# Patient Record
Sex: Female | Born: 1955 | Race: White | Hispanic: No | Marital: Married | Smoking: Never smoker
Health system: Southern US, Community
[De-identification: ages and names within clinical notes are randomized; demographics above are authoritative.]

## PROBLEM LIST (undated history)

## (undated) DIAGNOSIS — Z9889 Other specified postprocedural states: Secondary | ICD-10-CM

## (undated) DIAGNOSIS — M1990 Unspecified osteoarthritis, unspecified site: Secondary | ICD-10-CM

## (undated) DIAGNOSIS — R112 Nausea with vomiting, unspecified: Secondary | ICD-10-CM

## (undated) DIAGNOSIS — N189 Chronic kidney disease, unspecified: Secondary | ICD-10-CM

## (undated) DIAGNOSIS — G4730 Sleep apnea, unspecified: Secondary | ICD-10-CM

## (undated) DIAGNOSIS — M797 Fibromyalgia: Secondary | ICD-10-CM

## (undated) DIAGNOSIS — C801 Malignant (primary) neoplasm, unspecified: Secondary | ICD-10-CM

## (undated) HISTORY — PX: OTHER SURGICAL HISTORY: SHX169

## (undated) HISTORY — PX: BREAST SURGERY: SHX581

## (undated) HISTORY — PX: ABDOMINAL HYSTERECTOMY: SHX81

---

## 1998-11-10 ENCOUNTER — Other Ambulatory Visit: Admission: RE | Admit: 1998-11-10 | Discharge: 1998-11-10 | Payer: Self-pay | Admitting: Plastic Surgery

## 1998-11-10 ENCOUNTER — Encounter (INDEPENDENT_AMBULATORY_CARE_PROVIDER_SITE_OTHER): Payer: Self-pay

## 1999-06-03 ENCOUNTER — Encounter (INDEPENDENT_AMBULATORY_CARE_PROVIDER_SITE_OTHER): Payer: Self-pay | Admitting: Specialist

## 1999-06-03 ENCOUNTER — Inpatient Hospital Stay (HOSPITAL_COMMUNITY): Admission: RE | Admit: 1999-06-03 | Discharge: 1999-06-06 | Payer: Self-pay | Admitting: Urology

## 1999-07-30 ENCOUNTER — Other Ambulatory Visit: Admission: RE | Admit: 1999-07-30 | Discharge: 1999-07-30 | Payer: Self-pay | Admitting: Gynecology

## 1999-12-06 ENCOUNTER — Encounter: Payer: Self-pay | Admitting: Urology

## 1999-12-06 ENCOUNTER — Encounter: Admission: RE | Admit: 1999-12-06 | Discharge: 1999-12-06 | Payer: Self-pay | Admitting: Urology

## 2000-06-20 ENCOUNTER — Encounter: Admission: RE | Admit: 2000-06-20 | Discharge: 2000-06-20 | Payer: Self-pay | Admitting: Urology

## 2000-06-20 ENCOUNTER — Encounter: Payer: Self-pay | Admitting: Urology

## 2000-08-02 ENCOUNTER — Other Ambulatory Visit: Admission: RE | Admit: 2000-08-02 | Discharge: 2000-08-02 | Payer: Self-pay | Admitting: Gynecology

## 2000-09-24 ENCOUNTER — Ambulatory Visit (HOSPITAL_BASED_OUTPATIENT_CLINIC_OR_DEPARTMENT_OTHER): Admission: RE | Admit: 2000-09-24 | Discharge: 2000-09-24 | Payer: Self-pay | Admitting: Family Medicine

## 2000-12-20 ENCOUNTER — Encounter: Payer: Self-pay | Admitting: Urology

## 2000-12-20 ENCOUNTER — Encounter: Admission: RE | Admit: 2000-12-20 | Discharge: 2000-12-20 | Payer: Self-pay | Admitting: Urology

## 2001-06-08 ENCOUNTER — Encounter: Payer: Self-pay | Admitting: Urology

## 2001-06-08 ENCOUNTER — Encounter: Admission: RE | Admit: 2001-06-08 | Discharge: 2001-06-08 | Payer: Self-pay | Admitting: Urology

## 2001-08-14 ENCOUNTER — Other Ambulatory Visit: Admission: RE | Admit: 2001-08-14 | Discharge: 2001-08-14 | Payer: Self-pay | Admitting: Gynecology

## 2001-12-14 ENCOUNTER — Encounter: Payer: Self-pay | Admitting: Urology

## 2001-12-14 ENCOUNTER — Encounter: Admission: RE | Admit: 2001-12-14 | Discharge: 2001-12-14 | Payer: Self-pay | Admitting: Urology

## 2002-06-21 ENCOUNTER — Encounter: Admission: RE | Admit: 2002-06-21 | Discharge: 2002-06-21 | Payer: Self-pay | Admitting: Urology

## 2002-06-21 ENCOUNTER — Encounter: Payer: Self-pay | Admitting: Urology

## 2002-12-04 ENCOUNTER — Encounter: Admission: RE | Admit: 2002-12-04 | Discharge: 2002-12-04 | Payer: Self-pay | Admitting: Urology

## 2002-12-06 ENCOUNTER — Other Ambulatory Visit: Admission: RE | Admit: 2002-12-06 | Discharge: 2002-12-06 | Payer: Self-pay | Admitting: Gynecology

## 2003-06-09 ENCOUNTER — Ambulatory Visit (HOSPITAL_COMMUNITY): Admission: RE | Admit: 2003-06-09 | Discharge: 2003-06-09 | Payer: Self-pay | Admitting: Urology

## 2003-10-22 ENCOUNTER — Ambulatory Visit (HOSPITAL_COMMUNITY): Admission: RE | Admit: 2003-10-22 | Discharge: 2003-10-22 | Payer: Self-pay | Admitting: Family Medicine

## 2003-11-12 ENCOUNTER — Ambulatory Visit (HOSPITAL_COMMUNITY): Admission: RE | Admit: 2003-11-12 | Discharge: 2003-11-12 | Payer: Self-pay | Admitting: Unknown Physician Specialty

## 2003-11-19 ENCOUNTER — Encounter: Admission: RE | Admit: 2003-11-19 | Discharge: 2003-12-10 | Payer: Self-pay | Admitting: Optometry

## 2003-11-25 ENCOUNTER — Ambulatory Visit (HOSPITAL_COMMUNITY): Admission: RE | Admit: 2003-11-25 | Discharge: 2003-11-25 | Payer: Self-pay | Admitting: Unknown Physician Specialty

## 2004-01-12 ENCOUNTER — Ambulatory Visit (HOSPITAL_COMMUNITY): Admission: RE | Admit: 2004-01-12 | Discharge: 2004-01-12 | Payer: Self-pay | Admitting: Unknown Physician Specialty

## 2004-01-26 ENCOUNTER — Ambulatory Visit (HOSPITAL_COMMUNITY): Admission: RE | Admit: 2004-01-26 | Discharge: 2004-01-26 | Payer: Self-pay | Admitting: Urology

## 2004-03-02 ENCOUNTER — Other Ambulatory Visit: Admission: RE | Admit: 2004-03-02 | Discharge: 2004-03-02 | Payer: Self-pay | Admitting: Gynecology

## 2004-05-11 ENCOUNTER — Ambulatory Visit (HOSPITAL_COMMUNITY): Admission: RE | Admit: 2004-05-11 | Discharge: 2004-05-11 | Payer: Self-pay | Admitting: Unknown Physician Specialty

## 2004-07-28 ENCOUNTER — Ambulatory Visit (HOSPITAL_COMMUNITY): Admission: RE | Admit: 2004-07-28 | Discharge: 2004-07-28 | Payer: Self-pay | Admitting: Urology

## 2005-03-30 ENCOUNTER — Other Ambulatory Visit: Admission: RE | Admit: 2005-03-30 | Discharge: 2005-03-30 | Payer: Self-pay | Admitting: Gynecology

## 2005-10-12 ENCOUNTER — Encounter: Admission: RE | Admit: 2005-10-12 | Discharge: 2006-01-10 | Payer: Self-pay | Admitting: Family Medicine

## 2005-10-28 ENCOUNTER — Ambulatory Visit: Payer: Self-pay | Admitting: Internal Medicine

## 2005-11-11 ENCOUNTER — Ambulatory Visit: Payer: Self-pay | Admitting: Internal Medicine

## 2006-01-20 IMAGING — CR DG CHEST 2V
2 series · 2 of 2 positions shown · non-contrast
Comparison: none

CLINICAL DATA: Renal cell carcinoma, question metastatic disease.
 TWO VIEW CHEST
 The heart size and mediastinal contours are unremarkable.  The lungs are clear.  The visualized skeleton is unremarkable.  
 IMPRESSION
 No active disease.

[view not recorded (1 of 2)]
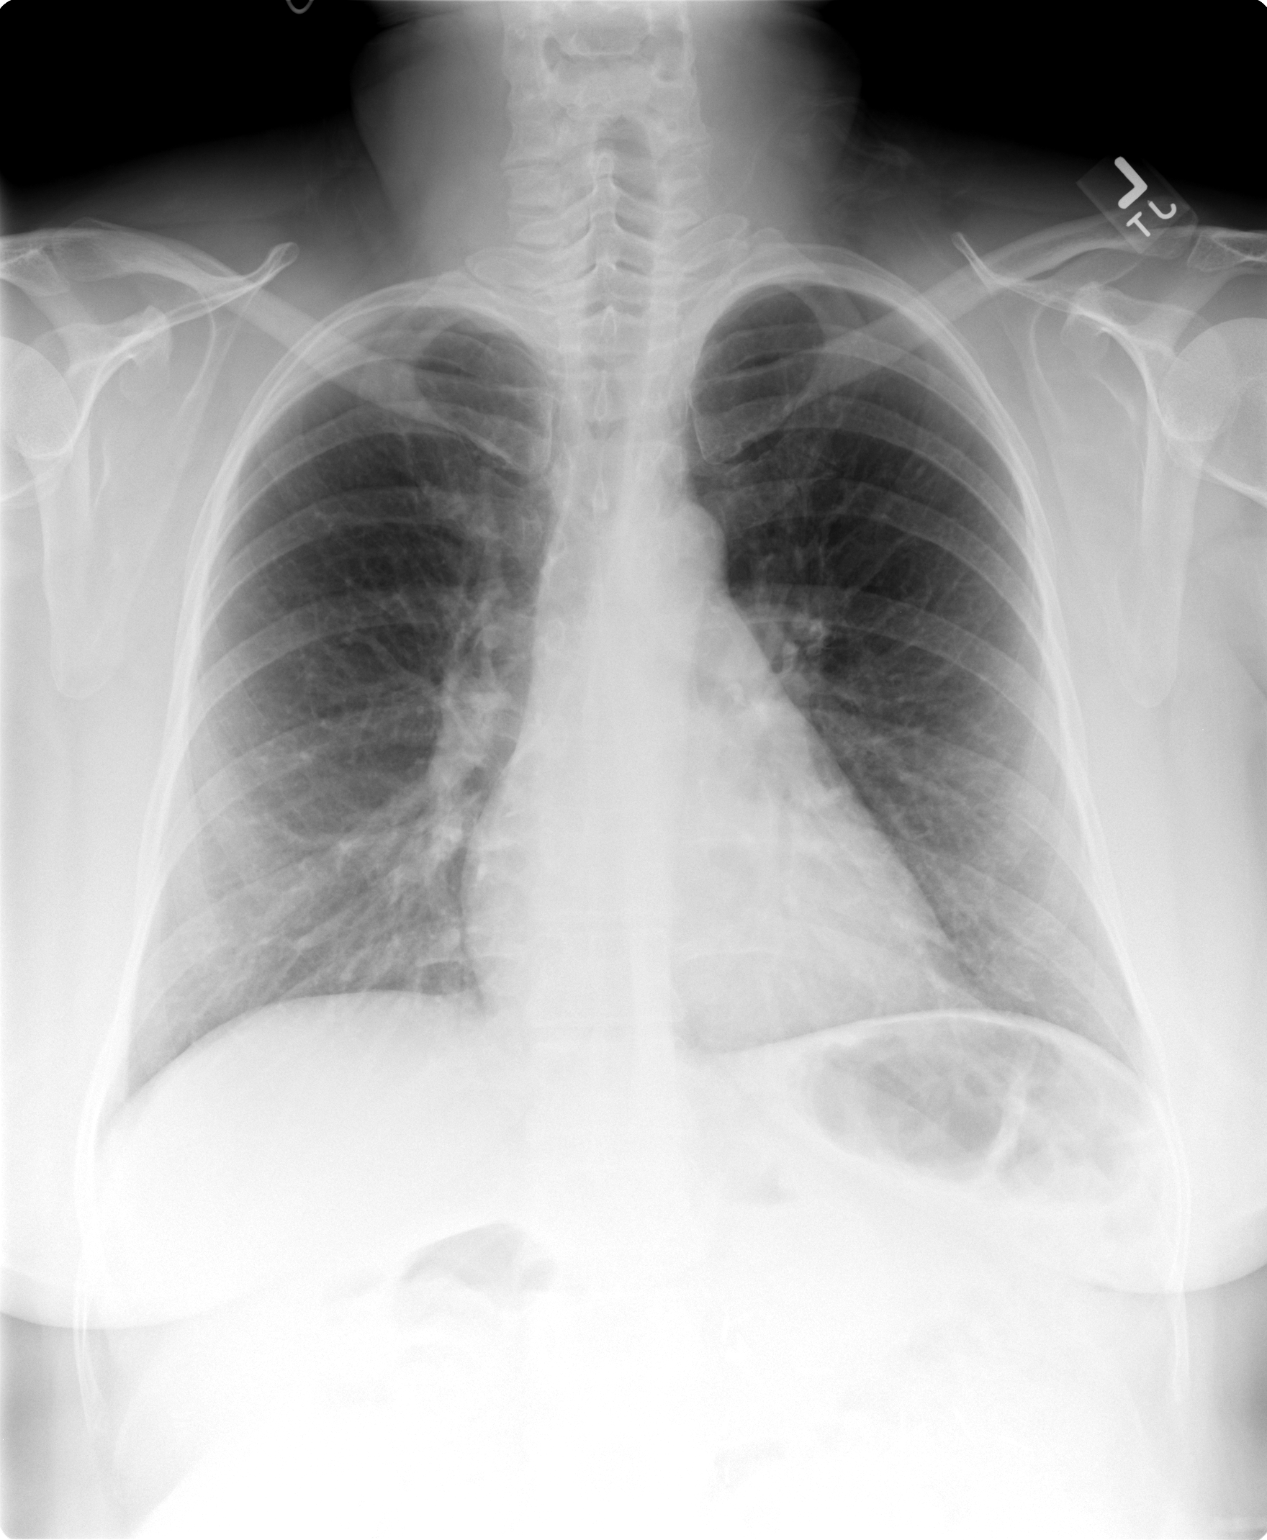

[view not recorded (2 of 2)]
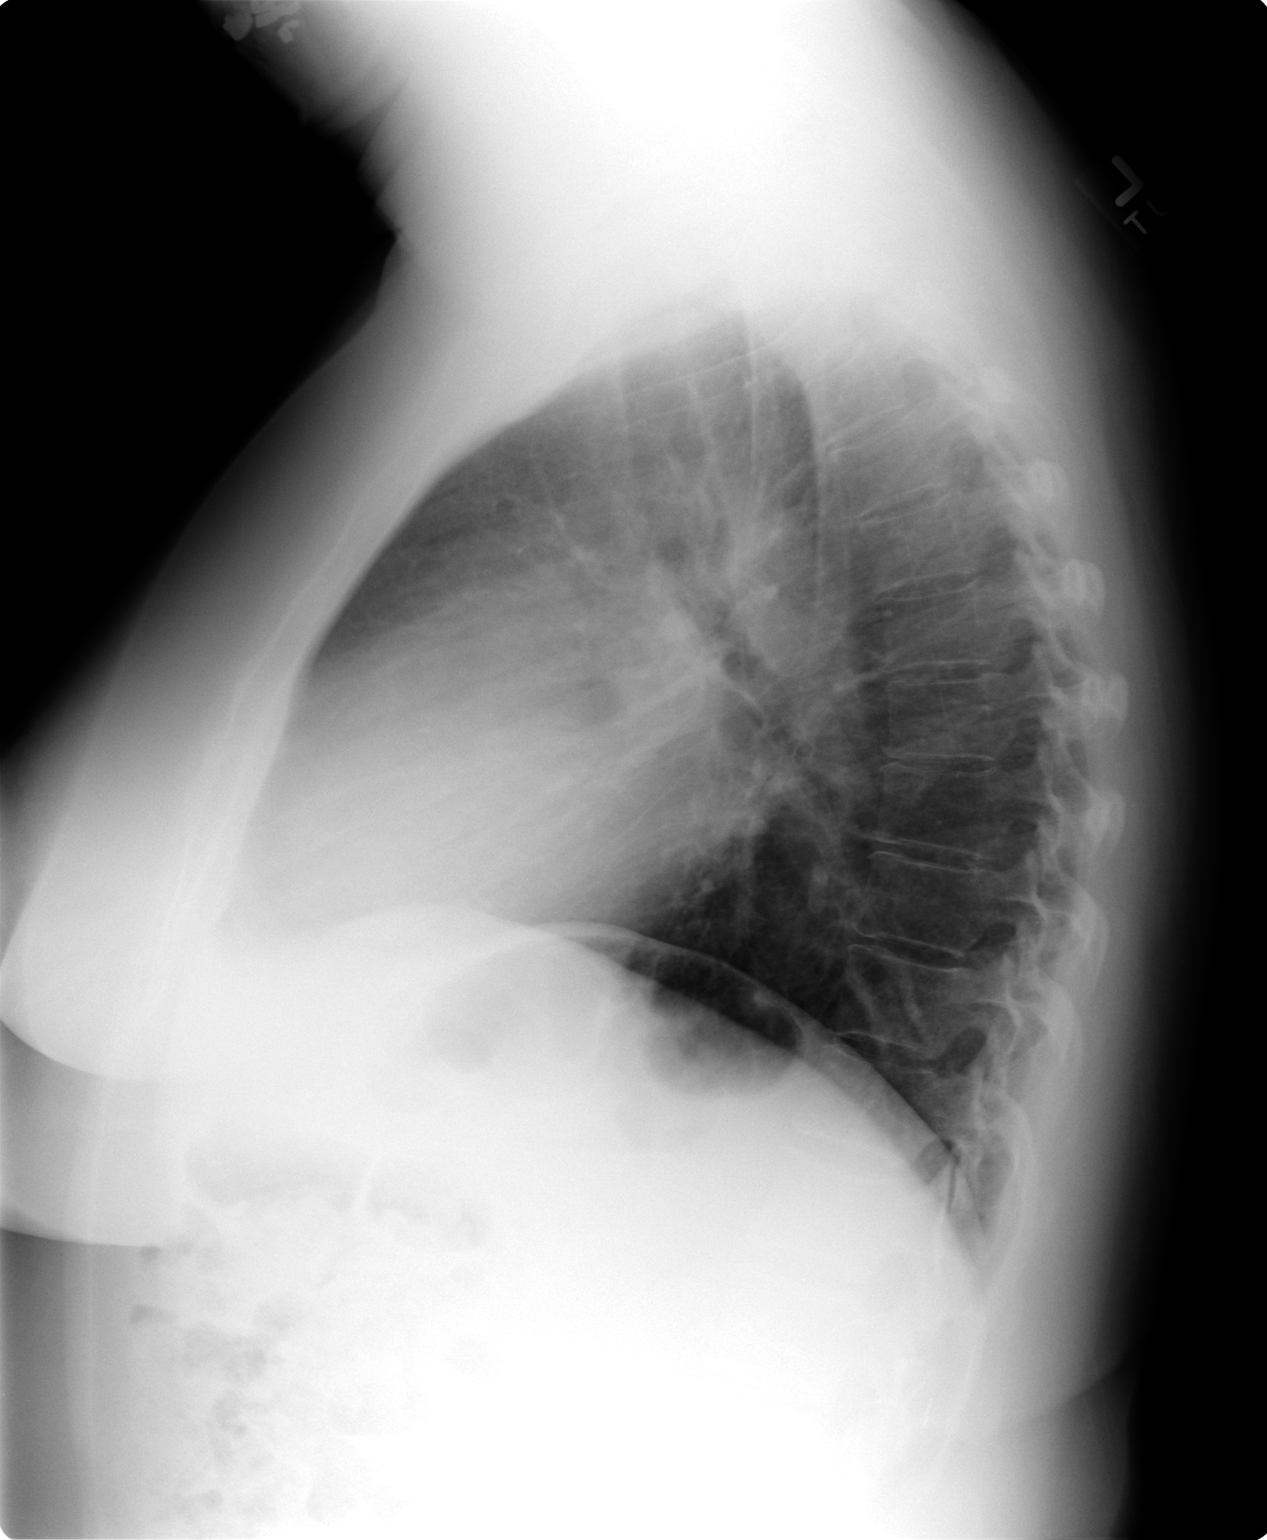

[2 of 2 positions shown; findings below may reference images not displayed]

## 2006-03-02 ENCOUNTER — Ambulatory Visit (HOSPITAL_COMMUNITY): Admission: RE | Admit: 2006-03-02 | Discharge: 2006-03-02 | Payer: Self-pay | Admitting: Unknown Physician Specialty

## 2006-07-20 ENCOUNTER — Encounter: Admission: RE | Admit: 2006-07-20 | Discharge: 2006-07-20 | Payer: Self-pay | Admitting: Orthopedic Surgery

## 2010-01-23 ENCOUNTER — Encounter: Payer: Self-pay | Admitting: Unknown Physician Specialty

## 2010-05-21 NOTE — Op Note (Signed)
[facility]  Patient:    Karla Bishop, Karla Bishop                          MRN: [id number] Proc. Date: 06/03/99 Adm. Date:  [id number] Disc. Date: [id number] Attending:  Evlyn Clines CC:         Tammy R. Collins Scotland, M.D.             Excell Seltzer. Annabell Howells, M.D.                           Operative Report  PROCEDURE:  Left radial nephrectomy.  PREOPERATIVE DIAGNOSIS:  Left renal mass.  POSTOPERATIVE DIAGNOSIS:  Left renal mass.  SURGEON:  Excell Seltzer. Annabell Howells, M.D.  ASSISTANT:  Veverly Fells. Vernie Ammons, M.D.  ANESTHESIA:  General.  DRAINS:  Foley.  SPECIMEN:  Left kidney.  COMPLICATIONS:  None.  INDICATION:  Karla Bishop is a 55 year old white female originally sent in consultation by Dr. Bayard Beaver. Spear for a left renal mass that was discovered incidentally on CT scan during evaluation for IBS.  Karla Bishop was found to have an enhancing left lower pole mass measuring 2 x 2 x 3 cm with irregular margins. After reviewing the films, it was felt that radical nephrectomy was indicated.  FINDINGS AND PROCEDURE:  The patient was taken to the operating room. Karla Bishop received Ancef 1 gm and was fitted with ______ and PAS hose.  Karla Bishop was placed in a supine position and general anesthetic was induced.  A Foley catheter was inserted using sterile technique.  A bump was placed under her left chest, her abdomen was prepped with Betadine solution and Karla Bishop was draped in the usual sterile fashion.  A left subcostal incision was made with a knife.  The subcutaneous tissues were opened with the Bovie.  The musculofascial layers were incised with the Bovie.  The peritoneal cavity was entered.  A self-retaining retractor was placed.  The white line of Toldt was taken down on the left, allowing medial reflection of the colon.  Posterior dissection of the kidney within the Gerotas fascia was performed.  We then dissected the colonic mesentery off of Gerotas fascia anteriorly.  The lower pole of the kidney was  dissected out and the ureter was divided between clips; a small vessel in this area was also divided.  We then dissected into the hilum, where the patient was noted to have two renal veins.  The lower pole renal vein was dissected out and then doubly ligated with 0 silk ties and a clip was placed across the base of this to secure hemostasis; this allowed easier exposure of the artery which was dissected out, doubly ligated with 0 silk ties and divided.   Once the artery was divided, the remaining main renal vein was dissected out and doubly ligated with 0 silk ties and divided.  Some remaining attachments to the pedicle were taken down between clips.  The upper pole was dissected out, leaving the adrenal gland in situ.  The specimen was then removed.  Inspection revealed some bleeding from the adrenal gland, which was controlled with clips and a 3-0 chromic suture.  Once all bleeding had been controlled in the fossa, the wound was irrigated.  The bowel was inspected; there was no evidence of injury or ischemia.  The spleen was apparent through the peritoneum and the upper portion of the renal fossa and it was without injury.  At this point, the bowel was returned to its appropriate position, the omentum was draped over the bowel, the abdominal wound was closed in two layers using running #1 PDS, the subcutaneous tissue was irrigated and the skin was closed with clips.  The patients anesthetic was reversed.  Karla Bishop was moved to the recovery room in stable condition.  There were no complications. DD:  06/03/99 TD:  06/07/99 Job: [id number]

## 2010-05-21 NOTE — Discharge Summary (Signed)
[facility]  Patient:    Karla Bishop, Karla Bishop                          MRN: [id number] Adm. Date:  [id number] Disc. Date: [id number] Attending:  Evlyn Clines CC:         Tammy R. Collins Scotland, M.D.             Excell Seltzer. Annabell Howells, M.D.                           Discharge Summary  HISTORY OF PRESENT ILLNESS:  Briefly, Ms. Pakula is a 55 year old white female who is being evaluated for an irritable bowel syndrome, ended up with a CT scan which revealed and incidental 2 x 2 x 3 cm left renal mass suspicious for renal cell carcinoma.  If was felt that nephrectomy was indicated.  ALLERGIES:  Her past history is pertinent for no allergies.  CURRENT MEDICATIONS: 1. Wellbutrin 150 mg daily. 2. Allegra one daily. 3. Premarin 0.625 mg daily. 4. Etodolac 400 mg daily as needed. ______ for arthritis, anxiety and    irritable bowel syndrome.  PAST SURGICAL HISTORY:  For a hysterectomy and anterior repair in 1884.  Right ovarian removal in 1987, left ovarian removal in 1991.  Breast reduction in 2000.  For additional details of the history and physical, please see the office H&P.  ACCESSORY CLINICAL INFORMATION:  Admission labs, CBC within normal limits. Chemistries within normal limits.  Blood type 0 positive.  Antibody negative.  HOSPITAL COURSE:  On the day of admission, the patient was taken to the operating room where she underwent a left radical nephrectomy through a left subcostal incision.  She tolerated the procedure well and was left with a Foley catheter.  Postoperatively, she did well.  Her Foley catheter was removed on the first postoperative day.  She was on a clear liquid diet the first postoperative day.  Hemoglobin was 11.6.  BMP was unremarkable. IV fluid were decreased.  Her activity was increased.  On second postoperative day she was doing well, incision was intact.  Her IV was discontinued and diet was advanced.  Her ambulation was increased.  On the third  postoperative day she was felt to be ready for discharge home.  Her final pathology revealed a renal cell carcinoma, clear cell type, 2.4 cm in greatest dimension confined with the kidney, first stage PT-1, N0, M0.  There were no complications during her admission.  At discharge medications include Vicodin one or two p.o. q.4-6h. p.r.n. pain. She was instructed to follow up with me in one week for staple removal.  DISPOSITION:  To home.  CONDITION:  Improved. DD:  06/22/99 TD:  06/23/99 Job: [id number]

## 2012-04-05 ENCOUNTER — Other Ambulatory Visit: Payer: Self-pay | Admitting: Orthopedic Surgery

## 2012-05-14 ENCOUNTER — Ambulatory Visit (HOSPITAL_COMMUNITY)
Admission: RE | Admit: 2012-05-14 | Discharge: 2012-05-14 | Disposition: A | Discharge status: Home / Self Care | Payer: 59 | Source: Ambulatory Visit | Attending: Orthopedic Surgery | Admitting: Orthopedic Surgery

## 2012-05-14 ENCOUNTER — Encounter (HOSPITAL_COMMUNITY)
Admission: RE | Admit: 2012-05-14 | Discharge: 2012-05-14 | Disposition: A | Discharge status: Home / Self Care | Payer: 59 | Source: Ambulatory Visit | Attending: Orthopedic Surgery | Admitting: Orthopedic Surgery

## 2012-05-14 ENCOUNTER — Encounter (HOSPITAL_COMMUNITY): Payer: Self-pay

## 2012-05-14 DIAGNOSIS — G4730 Sleep apnea, unspecified: Secondary | ICD-10-CM | POA: Insufficient documentation

## 2012-05-14 DIAGNOSIS — Z01818 Encounter for other preprocedural examination: Secondary | ICD-10-CM | POA: Insufficient documentation

## 2012-05-14 DIAGNOSIS — M47814 Spondylosis without myelopathy or radiculopathy, thoracic region: Secondary | ICD-10-CM | POA: Insufficient documentation

## 2012-05-14 DIAGNOSIS — Z01812 Encounter for preprocedural laboratory examination: Secondary | ICD-10-CM | POA: Insufficient documentation

## 2012-05-14 DIAGNOSIS — Z01810 Encounter for preprocedural cardiovascular examination: Secondary | ICD-10-CM | POA: Insufficient documentation

## 2012-05-14 HISTORY — DX: Nausea with vomiting, unspecified: R11.2

## 2012-05-14 HISTORY — DX: Chronic kidney disease, unspecified: N18.9

## 2012-05-14 HISTORY — DX: Sleep apnea, unspecified: G47.30

## 2012-05-14 HISTORY — DX: Unspecified osteoarthritis, unspecified site: M19.90

## 2012-05-14 HISTORY — DX: Malignant (primary) neoplasm, unspecified: C80.1

## 2012-05-14 HISTORY — DX: Nausea with vomiting, unspecified: Z98.890

## 2012-05-14 LAB — PROTIME-INR: Prothrombin Time: 12.1 seconds (ref 11.6–15.2)

## 2012-05-14 LAB — CBC
HCT: 39.8 % (ref 36.0–46.0)
Platelets: 225 10*3/uL (ref 150–400)
RDW: 13.1 % (ref 11.5–15.5)
WBC: 6.4 10*3/uL (ref 4.0–10.5)

## 2012-05-14 LAB — APTT: aPTT: 28 seconds (ref 24–37)

## 2012-05-14 LAB — URINALYSIS, ROUTINE W REFLEX MICROSCOPIC
Bilirubin Urine: NEGATIVE
Ketones, ur: NEGATIVE mg/dL
Nitrite: NEGATIVE
Urobilinogen, UA: 1 mg/dL (ref 0.0–1.0)

## 2012-05-14 LAB — BASIC METABOLIC PANEL
Chloride: 107 mEq/L (ref 96–112)
Creatinine, Ser: 1.24 mg/dL — ABNORMAL HIGH (ref 0.50–1.10)
GFR calc Af Amer: 55 mL/min — ABNORMAL LOW (ref >90)

## 2012-05-14 LAB — TYPE AND SCREEN: Antibody Screen: NEGATIVE

## 2012-05-14 LAB — URINE MICROSCOPIC-ADD ON

## 2012-05-14 LAB — SURGICAL PCR SCREEN
MRSA, PCR: NEGATIVE
Staphylococcus aureus: NEGATIVE

## 2012-05-14 NOTE — Pre-Procedure Instructions (Signed)
Karla Bishop  05/14/2012   Your procedure is scheduled on:  05-22-2012  Tuesday   Report to [facility] at 5:30 AM.  Call this number if you have problems the morning of surgery: [phone]   Remember:   Do not eat food or drink liquids after midnight.    Take these medicines the morning of surgery with A SIP OF WATER: pain medication if needed   Do not wear jewelry, make-up or nail polish.  Do not wear lotions, powders, or perfumes.   Do not shave 48 hours prior to surgery. .  Do not bring valuables to the hospital.  Contacts, dentures or bridgework may not be worn into surgery.  Leave suitcase in the car. After surgery it may be brought to your room.   For patients admitted to the hospital, checkout time is 11:00 AM the day of discharge.       Special Instructions: Shower using CHG 2 nights before surgery and the night before surgery.  If you shower the day of surgery use CHG.  Use special wash - you have one bottle of CHG for all showers.  You should use approximately 1/3 of the bottle for each shower.   Please read over the following fact sheets that you were given: Pain Booklet, Coughing and Deep Breathing, Blood Transfusion Information and Surgical Site Infection Prevention

## 2012-05-15 LAB — URINE CULTURE

## 2012-05-21 ENCOUNTER — Other Ambulatory Visit: Payer: Self-pay | Admitting: Orthopedic Surgery

## 2012-05-21 MED ORDER — CEFAZOLIN SODIUM-DEXTROSE 2-3 GM-% IV SOLR
2.0000 g | INTRAVENOUS | Status: AC
  Administered 2012-05-22: 2 g via INTRAVENOUS
  Filled 2012-05-21: qty 50

## 2012-05-21 MED ORDER — CHLORHEXIDINE GLUCONATE 4 % EX LIQD: 60.0000 mL | Freq: Once | CUTANEOUS | Status: DC

## 2012-05-22 ENCOUNTER — Inpatient Hospital Stay (HOSPITAL_COMMUNITY)
Admission: RE | Admit: 2012-05-22 | Discharge: 2012-05-25 | DRG: 470 | Disposition: A | Discharge status: Home / Self Care | Payer: 59 | Source: Ambulatory Visit | Attending: Orthopedic Surgery | Admitting: Orthopedic Surgery

## 2012-05-22 ENCOUNTER — Inpatient Hospital Stay (HOSPITAL_COMMUNITY): Payer: 59 | Admitting: Critical Care Medicine

## 2012-05-22 ENCOUNTER — Encounter (HOSPITAL_COMMUNITY): Payer: Self-pay | Admitting: Critical Care Medicine

## 2012-05-22 ENCOUNTER — Encounter (HOSPITAL_COMMUNITY)
Admission: RE | Disposition: A | Discharge status: Home / Self Care | Payer: Self-pay | Source: Ambulatory Visit | Attending: Orthopedic Surgery

## 2012-05-22 ENCOUNTER — Encounter (HOSPITAL_COMMUNITY): Payer: Self-pay | Admitting: *Deleted

## 2012-05-22 DIAGNOSIS — N189 Chronic kidney disease, unspecified: Secondary | ICD-10-CM | POA: Diagnosis present

## 2012-05-22 DIAGNOSIS — G4730 Sleep apnea, unspecified: Secondary | ICD-10-CM | POA: Diagnosis present

## 2012-05-22 DIAGNOSIS — Z85528 Personal history of other malignant neoplasm of kidney: Secondary | ICD-10-CM

## 2012-05-22 DIAGNOSIS — M1710 Unilateral primary osteoarthritis, unspecified knee: Principal | ICD-10-CM | POA: Diagnosis present

## 2012-05-22 DIAGNOSIS — Z9079 Acquired absence of other genital organ(s): Secondary | ICD-10-CM

## 2012-05-22 DIAGNOSIS — M1711 Unilateral primary osteoarthritis, right knee: Secondary | ICD-10-CM

## 2012-05-22 DIAGNOSIS — Z905 Acquired absence of kidney: Secondary | ICD-10-CM

## 2012-05-22 HISTORY — PX: TOTAL KNEE ARTHROPLASTY: SHX125

## 2012-05-22 SURGERY — ARTHROPLASTY, KNEE, TOTAL
Anesthesia: General | Site: Knee | Laterality: Right | Wound class: Clean

## 2012-05-22 MED ORDER — CLONIDINE HCL (ANALGESIA) 100 MCG/ML EP SOLN
EPIDURAL | Status: DC | PRN
  Administered 2012-05-22: 1 mL

## 2012-05-22 MED ORDER — 0.9 % SODIUM CHLORIDE (POUR BTL) OPTIME
TOPICAL | Status: DC | PRN
  Administered 2012-05-22 (×3): 1000 mL

## 2012-05-22 MED ORDER — PROPOFOL 10 MG/ML IV BOLUS
INTRAVENOUS | Status: DC | PRN
  Administered 2012-05-22: 20 mg via INTRAVENOUS
  Administered 2012-05-22: 180 mg via INTRAVENOUS

## 2012-05-22 MED ORDER — SODIUM CHLORIDE 0.9 % IJ SOLN
INTRAMUSCULAR | Status: DC | PRN
  Administered 2012-05-22: 40 mL via INTRAVENOUS

## 2012-05-22 MED ORDER — ONDANSETRON HCL 4 MG/2ML IJ SOLN
4.0000 mg | Freq: Four times a day (QID) | INTRAMUSCULAR | Status: DC | PRN
  Filled 2012-05-22: qty 2

## 2012-05-22 MED ORDER — METHOCARBAMOL 100 MG/ML IJ SOLN
500.0000 mg | Freq: Four times a day (QID) | INTRAVENOUS | Status: DC | PRN
  Filled 2012-05-22: qty 5

## 2012-05-22 MED ORDER — BUPIVACAINE HCL (PF) 0.25 % IJ SOLN
INTRAMUSCULAR | Status: AC
  Filled 2012-05-22: qty 30

## 2012-05-22 MED ORDER — FENTANYL CITRATE 0.05 MG/ML IJ SOLN
INTRAMUSCULAR | Status: DC | PRN
  Administered 2012-05-22: 50 ug via INTRAVENOUS
  Administered 2012-05-22: 25 ug via INTRAVENOUS
  Administered 2012-05-22 (×2): 50 ug via INTRAVENOUS
  Administered 2012-05-22: 25 ug via INTRAVENOUS
  Administered 2012-05-22 (×3): 100 ug via INTRAVENOUS

## 2012-05-22 MED ORDER — DOCUSATE SODIUM 100 MG PO CAPS
100.0000 mg | ORAL_CAPSULE | Freq: Two times a day (BID) | ORAL | Status: DC
  Administered 2012-05-22 – 2012-05-25 (×7): 100 mg via ORAL
  Filled 2012-05-22 (×7): qty 1

## 2012-05-22 MED ORDER — CLONIDINE HCL (ANALGESIA) 100 MCG/ML EP SOLN
150.0000 ug | Freq: Once | EPIDURAL | Status: DC
  Filled 2012-05-22: qty 1.5

## 2012-05-22 MED ORDER — BUPIVACAINE HCL (PF) 0.25 % IJ SOLN
INTRAMUSCULAR | Status: DC | PRN
  Administered 2012-05-22: 30 mL

## 2012-05-22 MED ORDER — METOCLOPRAMIDE HCL 5 MG PO TABS
5.0000 mg | ORAL_TABLET | Freq: Three times a day (TID) | ORAL | Status: DC | PRN
  Filled 2012-05-22: qty 2

## 2012-05-22 MED ORDER — GLYCOPYRROLATE 0.2 MG/ML IJ SOLN
INTRAMUSCULAR | Status: DC | PRN
  Administered 2012-05-22: 0.4 mg via INTRAVENOUS

## 2012-05-22 MED ORDER — HYDROMORPHONE HCL PF 1 MG/ML IJ SOLN
INTRAMUSCULAR | Status: AC
  Filled 2012-05-22: qty 1

## 2012-05-22 MED ORDER — DEXAMETHASONE SODIUM PHOSPHATE 4 MG/ML IJ SOLN
INTRAMUSCULAR | Status: DC | PRN
  Administered 2012-05-22: 4 mg via INTRAVENOUS

## 2012-05-22 MED ORDER — ARTIFICIAL TEARS OP OINT
TOPICAL_OINTMENT | OPHTHALMIC | Status: DC | PRN
  Administered 2012-05-22: 1 via OPHTHALMIC

## 2012-05-22 MED ORDER — NALOXONE HCL 0.4 MG/ML IJ SOLN: 0.4000 mg | INTRAMUSCULAR | Status: DC | PRN

## 2012-05-22 MED ORDER — WARFARIN - PHARMACIST DOSING INPATIENT: Freq: Every day | Status: DC

## 2012-05-22 MED ORDER — HYDROMORPHONE HCL PF 1 MG/ML IJ SOLN
0.2500 mg | INTRAMUSCULAR | Status: DC | PRN
  Administered 2012-05-22 (×2): 0.5 mg via INTRAVENOUS

## 2012-05-22 MED ORDER — METOCLOPRAMIDE HCL 5 MG/ML IJ SOLN
5.0000 mg | Freq: Three times a day (TID) | INTRAMUSCULAR | Status: DC | PRN
  Administered 2012-05-23: 10 mg via INTRAVENOUS
  Filled 2012-05-22: qty 2

## 2012-05-22 MED ORDER — METHOCARBAMOL 500 MG PO TABS
500.0000 mg | ORAL_TABLET | Freq: Four times a day (QID) | ORAL | Status: DC | PRN
  Administered 2012-05-22 – 2012-05-25 (×7): 500 mg via ORAL
  Filled 2012-05-22 (×7): qty 1

## 2012-05-22 MED ORDER — LIDOCAINE HCL (CARDIAC) 20 MG/ML IV SOLN
INTRAVENOUS | Status: DC | PRN
  Administered 2012-05-22: 40 mg via INTRAVENOUS

## 2012-05-22 MED ORDER — MENTHOL 3 MG MT LOZG: 1.0000 | LOZENGE | OROMUCOSAL | Status: DC | PRN

## 2012-05-22 MED ORDER — HYDROCODONE-ACETAMINOPHEN 10-325 MG PO TABS
1.0000 | ORAL_TABLET | ORAL | Status: DC | PRN
  Administered 2012-05-23: 2 via ORAL
  Administered 2012-05-23 – 2012-05-25 (×10): 1 via ORAL
  Filled 2012-05-22: qty 1
  Filled 2012-05-22: qty 2
  Filled 2012-05-22: qty 1
  Filled 2012-05-22 (×2): qty 2
  Filled 2012-05-22 (×4): qty 1
  Filled 2012-05-22: qty 2
  Filled 2012-05-22: qty 1

## 2012-05-22 MED ORDER — ONDANSETRON HCL 4 MG/2ML IJ SOLN
INTRAMUSCULAR | Status: DC | PRN
  Administered 2012-05-22: 4 mg via INTRAVENOUS

## 2012-05-22 MED ORDER — MORPHINE SULFATE (PF) 1 MG/ML IV SOLN
INTRAVENOUS | Status: AC
  Filled 2012-05-22: qty 25

## 2012-05-22 MED ORDER — WARFARIN VIDEO
Freq: Once | Status: AC
  Administered 2012-05-22: 17:00:00

## 2012-05-22 MED ORDER — ONDANSETRON HCL 4 MG/2ML IJ SOLN
4.0000 mg | Freq: Four times a day (QID) | INTRAMUSCULAR | Status: DC | PRN
  Administered 2012-05-22: 4 mg via INTRAVENOUS

## 2012-05-22 MED ORDER — COUMADIN BOOK
Freq: Once | Status: AC
  Administered 2012-05-22: 17:00:00
  Filled 2012-05-22: qty 1

## 2012-05-22 MED ORDER — LACTATED RINGERS IV SOLN
INTRAVENOUS | Status: DC | PRN
  Administered 2012-05-22 (×2): via INTRAVENOUS

## 2012-05-22 MED ORDER — POTASSIUM CHLORIDE IN NACL 20-0.9 MEQ/L-% IV SOLN
INTRAVENOUS | Status: AC
  Administered 2012-05-22: 20:00:00 via INTRAVENOUS
  Filled 2012-05-22 (×2): qty 1000

## 2012-05-22 MED ORDER — MORPHINE SULFATE 4 MG/ML IJ SOLN
INTRAMUSCULAR | Status: DC | PRN
  Administered 2012-05-22: 4 mg via SUBCUTANEOUS

## 2012-05-22 MED ORDER — CEFAZOLIN SODIUM-DEXTROSE 2-3 GM-% IV SOLR
2.0000 g | Freq: Three times a day (TID) | INTRAVENOUS | Status: AC
  Administered 2012-05-22 – 2012-05-23 (×2): 2 g via INTRAVENOUS
  Filled 2012-05-22 (×2): qty 50

## 2012-05-22 MED ORDER — SODIUM CHLORIDE 0.9 % IR SOLN
Status: DC | PRN
  Administered 2012-05-22: 3000 mL

## 2012-05-22 MED ORDER — MIDAZOLAM HCL 5 MG/5ML IJ SOLN
INTRAMUSCULAR | Status: DC | PRN
  Administered 2012-05-22: 2 mg via INTRAVENOUS

## 2012-05-22 MED ORDER — BUPIVACAINE LIPOSOME 1.3 % IJ SUSP
20.0000 mL | Freq: Once | INTRAMUSCULAR | Status: AC
  Administered 2012-05-22: 20 mL
  Filled 2012-05-22: qty 20

## 2012-05-22 MED ORDER — ROCURONIUM BROMIDE 100 MG/10ML IV SOLN
INTRAVENOUS | Status: DC | PRN
  Administered 2012-05-22: 50 mg via INTRAVENOUS

## 2012-05-22 MED ORDER — ONDANSETRON HCL 4 MG PO TABS
4.0000 mg | ORAL_TABLET | Freq: Four times a day (QID) | ORAL | Status: DC | PRN
  Administered 2012-05-24: 4 mg via ORAL
  Filled 2012-05-22: qty 1

## 2012-05-22 MED ORDER — ACETAMINOPHEN 325 MG PO TABS: 650.0000 mg | ORAL_TABLET | Freq: Four times a day (QID) | ORAL | Status: DC | PRN

## 2012-05-22 MED ORDER — DIPHENHYDRAMINE HCL 50 MG/ML IJ SOLN: 12.5000 mg | Freq: Four times a day (QID) | INTRAMUSCULAR | Status: DC | PRN

## 2012-05-22 MED ORDER — SODIUM CHLORIDE 0.9 % IJ SOLN
INTRAMUSCULAR | Status: AC
  Filled 2012-05-22: qty 12

## 2012-05-22 MED ORDER — PHENOL 1.4 % MT LIQD: 1.0000 | OROMUCOSAL | Status: DC | PRN

## 2012-05-22 MED ORDER — DIPHENHYDRAMINE HCL 12.5 MG/5ML PO ELIX: 12.5000 mg | ORAL_SOLUTION | Freq: Four times a day (QID) | ORAL | Status: DC | PRN

## 2012-05-22 MED ORDER — MORPHINE SULFATE 4 MG/ML IJ SOLN
INTRAMUSCULAR | Status: AC
  Filled 2012-05-22: qty 1

## 2012-05-22 MED ORDER — SODIUM CHLORIDE 0.9 % IJ SOLN: 9.0000 mL | INTRAMUSCULAR | Status: DC | PRN

## 2012-05-22 MED ORDER — PHENYLEPHRINE HCL 10 MG/ML IJ SOLN
INTRAMUSCULAR | Status: DC | PRN
  Administered 2012-05-22: 80 ug via INTRAVENOUS

## 2012-05-22 MED ORDER — NEOSTIGMINE METHYLSULFATE 1 MG/ML IJ SOLN
INTRAMUSCULAR | Status: DC | PRN
  Administered 2012-05-22: 3 mg via INTRAVENOUS

## 2012-05-22 MED ORDER — WARFARIN SODIUM 7.5 MG PO TABS
7.5000 mg | ORAL_TABLET | Freq: Once | ORAL | Status: AC
  Administered 2012-05-22: 7.5 mg via ORAL
  Filled 2012-05-22: qty 1

## 2012-05-22 MED ORDER — ACETAMINOPHEN 650 MG RE SUPP: 650.0000 mg | Freq: Four times a day (QID) | RECTAL | Status: DC | PRN

## 2012-05-22 MED ORDER — MORPHINE SULFATE (PF) 1 MG/ML IV SOLN
INTRAVENOUS | Status: DC
  Administered 2012-05-22: 8 mg via INTRAVENOUS
  Administered 2012-05-22: 11:00:00 via INTRAVENOUS
  Administered 2012-05-22 (×2): 3 mg via INTRAVENOUS
  Administered 2012-05-23: 06:00:00 via INTRAVENOUS
  Administered 2012-05-23: 5 mg via INTRAVENOUS
  Filled 2012-05-22: qty 25

## 2012-05-22 SURGICAL SUPPLY — 80 items
BANDAGE ELASTIC 4 VELCRO ST LF (GAUZE/BANDAGES/DRESSINGS) IMPLANT
BANDAGE ESMARK 6X9 LF (GAUZE/BANDAGES/DRESSINGS) ×1 IMPLANT
BLADE SAG 18X100X1.27 (BLADE) ×2 IMPLANT
BLADE SAW SGTL 13.0X1.19X90.0M (BLADE) ×2 IMPLANT
BNDG COHESIVE 6X5 TAN STRL LF (GAUZE/BANDAGES/DRESSINGS) ×2 IMPLANT
BNDG ELASTIC 6X10 VLCR STRL LF (GAUZE/BANDAGES/DRESSINGS) IMPLANT
BNDG ELASTIC 6X15 VLCR STRL LF (GAUZE/BANDAGES/DRESSINGS) ×2 IMPLANT
BNDG ESMARK 6X9 LF (GAUZE/BANDAGES/DRESSINGS) ×2
BOWL SMART MIX CTS (DISPOSABLE) ×2 IMPLANT
CEMENT BONE SIMPLEX SPEEDSET (Cement) ×4 IMPLANT
CLOTH BEACON ORANGE TIMEOUT ST (SAFETY) ×2 IMPLANT
COVER SURGICAL LIGHT HANDLE (MISCELLANEOUS) ×4 IMPLANT
CUFF TOURNIQUET SINGLE 34IN LL (TOURNIQUET CUFF) ×2 IMPLANT
CUFF TOURNIQUET SINGLE 44IN (TOURNIQUET CUFF) IMPLANT
DRAPE INCISE IOBAN 66X45 STRL (DRAPES) ×2 IMPLANT
DRAPE ORTHO SPLIT 77X108 STRL (DRAPES) ×6
DRAPE SURG ORHT 6 SPLT 77X108 (DRAPES) ×3 IMPLANT
DRAPE U-SHAPE 47X51 STRL (DRAPES) ×2 IMPLANT
DRAPE X-RAY CASS 24X20 (DRAPES) IMPLANT
DRSG PAD ABDOMINAL 8X10 ST (GAUZE/BANDAGES/DRESSINGS) IMPLANT
DURAPREP 26ML APPLICATOR (WOUND CARE) ×4 IMPLANT
ELECT REM PT RETURN 9FT ADLT (ELECTROSURGICAL) ×2
ELECTRODE REM PT RTRN 9FT ADLT (ELECTROSURGICAL) ×1 IMPLANT
EVACUATOR 1/8 PVC DRAIN (DRAIN) ×2 IMPLANT
FACESHIELD LNG OPTICON STERILE (SAFETY) ×2 IMPLANT
GAUZE XEROFORM 5X9 LF (GAUZE/BANDAGES/DRESSINGS) IMPLANT
GLOVE BIO SURGEON STRL SZ7 (GLOVE) ×4 IMPLANT
GLOVE BIOGEL PI IND STRL 7.0 (GLOVE) ×2 IMPLANT
GLOVE BIOGEL PI IND STRL 7.5 (GLOVE) ×3 IMPLANT
GLOVE BIOGEL PI IND STRL 8 (GLOVE) ×1 IMPLANT
GLOVE BIOGEL PI INDICATOR 7.0 (GLOVE) ×2
GLOVE BIOGEL PI INDICATOR 7.5 (GLOVE) ×3
GLOVE BIOGEL PI INDICATOR 8 (GLOVE) ×1
GLOVE ECLIPSE 7.0 STRL STRAW (GLOVE) ×8 IMPLANT
GLOVE SURG ORTHO 8.0 STRL STRW (GLOVE) ×2 IMPLANT
GOWN PREVENTION PLUS LG XLONG (DISPOSABLE) IMPLANT
GOWN PREVENTION PLUS XLARGE (GOWN DISPOSABLE) IMPLANT
GOWN STRL NON-REIN LRG LVL3 (GOWN DISPOSABLE) ×4 IMPLANT
HANDPIECE INTERPULSE COAX TIP (DISPOSABLE) ×2
HOOD PEEL AWAY FACE SHEILD DIS (HOOD) ×8 IMPLANT
IMMOBILIZER KNEE 20 (SOFTGOODS)
IMMOBILIZER KNEE 20 THIGH 36 (SOFTGOODS) IMPLANT
IMMOBILIZER KNEE 22 UNIV (SOFTGOODS) ×2 IMPLANT
IMMOBILIZER KNEE 24 THIGH 36 (MISCELLANEOUS) IMPLANT
IMMOBILIZER KNEE 24 UNIV (MISCELLANEOUS)
KIT BASIN OR (CUSTOM PROCEDURE TRAY) ×2 IMPLANT
KIT ROOM TURNOVER OR (KITS) ×2 IMPLANT
MANIFOLD NEPTUNE II (INSTRUMENTS) ×2 IMPLANT
MARKER SPHERE PSV REFLC THRD 5 (MARKER) IMPLANT
NEEDLE 18GX1X1/2 (RX/OR ONLY) (NEEDLE) ×2 IMPLANT
NEEDLE SPNL 18GX3.5 QUINCKE PK (NEEDLE) ×2 IMPLANT
NEEDLE SPNL 20GX3.5 QUINCKE YW (NEEDLE) ×2 IMPLANT
NS IRRIG 1000ML POUR BTL (IV SOLUTION) ×2 IMPLANT
PACK TOTAL JOINT (CUSTOM PROCEDURE TRAY) ×2 IMPLANT
PAD ARMBOARD 7.5X6 YLW CONV (MISCELLANEOUS) ×2 IMPLANT
PAD CAST 4YDX4 CTTN HI CHSV (CAST SUPPLIES) IMPLANT
PADDING CAST COTTON 4X4 STRL (CAST SUPPLIES)
PADDING CAST COTTON 6X4 STRL (CAST SUPPLIES) ×2 IMPLANT
PENCIL BUTTON HOLSTER BLD 10FT (ELECTRODE) ×2 IMPLANT
PIN SCHANZ 4MM 130MM (PIN) IMPLANT
RUBBERBAND STERILE (MISCELLANEOUS) ×2 IMPLANT
SET HNDPC FAN SPRY TIP SCT (DISPOSABLE) ×1 IMPLANT
SPONGE GAUZE 4X4 12PLY (GAUZE/BANDAGES/DRESSINGS) IMPLANT
SPONGE LAP 18X18 X RAY DECT (DISPOSABLE) IMPLANT
STAPLER VISISTAT 35W (STAPLE) IMPLANT
SUCTION FRAZIER TIP 10 FR DISP (SUCTIONS) ×2 IMPLANT
SUT ETHILON 3 0 PS 1 (SUTURE) IMPLANT
SUT VIC AB 0 CTB1 27 (SUTURE) ×6 IMPLANT
SUT VIC AB 1 CT1 27 (SUTURE) ×5
SUT VIC AB 1 CT1 27XBRD ANBCTR (SUTURE) ×5 IMPLANT
SUT VIC AB 2-0 CT1 27 (SUTURE) ×6
SUT VIC AB 2-0 CT1 TAPERPNT 27 (SUTURE) ×3 IMPLANT
SYR 30ML SLIP (SYRINGE) ×2 IMPLANT
SYR TB 1ML LUER SLIP (SYRINGE) IMPLANT
TOWEL OR 17X24 6PK STRL BLUE (TOWEL DISPOSABLE) ×4 IMPLANT
TOWEL OR 17X26 10 PK STRL BLUE (TOWEL DISPOSABLE) ×4 IMPLANT
TRAY FOLEY CATH 14FR (SET/KITS/TRAYS/PACK) ×2 IMPLANT
TUBE CONNECTING 12X1/4 (SUCTIONS) ×2 IMPLANT
WATER STERILE IRR 1000ML POUR (IV SOLUTION) ×4 IMPLANT
YANKAUER SUCT BULB TIP NO VENT (SUCTIONS) ×2 IMPLANT

## 2012-05-22 NOTE — Preoperative (Signed)
Beta Blockers   Reason not to administer Beta Blockers:Not Applicable 

## 2012-05-22 NOTE — Anesthesia Postprocedure Evaluation (Signed)
  Anesthesia Post-op Note  Patient: Karla Bishop  Procedure(s) Performed: Procedure(s) with comments: TOTAL KNEE ARTHROPLASTY- right (Right) - Right Total Knee Arthroplasty   Patient Location: PACU  Anesthesia Type:General  Level of Consciousness: awake and alert   Airway and Oxygen Therapy: Patient Spontanous Breathing  Post-op Pain: mild  Post-op Assessment: Post-op Vital signs reviewed, Patient's Cardiovascular Status Stable, Respiratory Function Stable, Patent Airway, No signs of Nausea or vomiting and Pain level controlled  Post-op Vital Signs: stable  Complications: No apparent anesthesia complications

## 2012-05-22 NOTE — Progress Notes (Signed)
ANTICOAGULATION CONSULT NOTE - Initial Consult  Pharmacy Consult for warfarin  Indication: VTE prophylaxis  Allergies  Allergen Reactions  . Percocet (Oxycodone-Acetaminophen) Other (See Comments)    Per patient "it does not agree with me, I do not like the way it makes me feel"    Patient Measurements: Height: 5\' 3"  (160 cm) Weight: 212 lb 9.6 oz (96.435 kg) IBW/kg (Calculated) : 52.4  Vital Signs: Temp: 97.7 F (36.5 C) (05/20 1515) Temp src: Oral (05/20 0557) BP: 117/59 mmHg (05/20 1503) Pulse Rate: 86 (05/20 1503)  Labs: No results found for this basename: HGB, HCT, PLT, APTT, LABPROT, INR, HEPARINUNFRC, CREATININE, CKTOTAL, CKMB, TROPONINI,  in the last 72 hours  CrCl is unknown because there is no height on file for the current visit.   Medical History: Past Medical History  Diagnosis Date  . PONV (postoperative nausea and vomiting)   . Sleep apnea     no cpap  . Chronic kidney disease     left kidney removed for renal cell carcimona  . Cancer     renal cell  . Arthritis     Medications:  Prescriptions prior to admission  Medication Sig Dispense Refill  . traMADol (ULTRAM) 50 MG tablet Take 50-100 mg by mouth 3 (three) times daily as needed for pain.        Assessment: Admit Complaint: 57 y.o.  female  admitted 05/22/2012 s/p TKA.  Pharmacy consulted to dose warfarin for VTE prophylaxsis.  PMH: Renal cell CA s/p L nephrectomy, OSA,   Assessment: Anticoagulation: VTE Prophylaxis: Warfarin risk score = 5 points, will begin Warfarin 7.5 mg PO x 1 tonight, send book and video.   PTA Medication Issues: Home Meds Ordered:  Only Tramadol PTA  Best Practices: VTE Prophylaxis:  Warfarin  Goal of Therapy:  INR 2-3 Monitor platelets by anticoagulation protocol: Yes   Plan:  1. Warfarin 7.5 mg PO x 1 2. Warfarin book and video   Thank you for allowing pharmacy to be a part of this patients care team.  Lovenia Kim Pharm.D., BCPS Clinical  Pharmacist 05/22/2012 4:49 PM Pager: [phone] Phone: [phone]

## 2012-05-22 NOTE — Transfer of Care (Signed)
Immediate Anesthesia Transfer of Care Note  Patient: Karla Bishop  Procedure(s) Performed: Procedure(s) with comments: TOTAL KNEE ARTHROPLASTY- right (Right) - Right Total Knee Arthroplasty   Patient Location: PACU  Anesthesia Type:General  Level of Consciousness: awake, alert  and oriented  Airway & Oxygen Therapy: Patient Spontanous Breathing and Patient connected to nasal cannula oxygen  Post-op Assessment: Report given to PACU RN, Post -op Vital signs reviewed and stable and Patient moving all extremities X 4  Post vital signs: Reviewed and stable  Complications: No apparent anesthesia complications

## 2012-05-22 NOTE — Progress Notes (Signed)
Orthopedic Tech Progress Note Patient Details:  Karla Bishop 1955/04/19 [id number] CPM applied to Right LE with appropriate settings. No OHF available at this time.  CPM Right Knee CPM Right Knee: On Right Knee Flexion (Degrees): 60 Right Knee Extension (Degrees): 0   Asia R Thompson 05/22/2012, 1:23 PM

## 2012-05-22 NOTE — Brief Op Note (Signed)
05/22/2012  10:27 AM  PATIENT:  Karla Bishop  57 y.o. female  PRE-OPERATIVE DIAGNOSIS:  Right Knee Osteoarthritis  POST-OPERATIVE DIAGNOSIS:  Right Knee Osteoarthritis  PROCEDURE:  Procedure(s): TOTAL KNEE ARTHROPLASTY- right  SURGEON:  Surgeon(s): Cammy Copa, MD  ASSISTANT: s vernon pa  ANESTHESIA:   general  EBL: 100 ml    Total I/O In: 1700 [I.V.:1700] Out: 300 [Urine:200; Blood:100]  BLOOD ADMINISTERED: none  DRAINS: none   LOCAL MEDICATIONS USED:  none  SPECIMEN:  No Specimen  COUNTS:  YES  TOURNIQUET:   Total Tourniquet Time Documented: Thigh (Right) - 100 minutes Total: Thigh (Right) - 100 minutes   DICTATION: .Other Dictation: Dictation Number [id number]  PLAN OF CARE: Admit to inpatient   PATIENT DISPOSITION:  PACU - hemodynamically stable

## 2012-05-22 NOTE — H&P (Addendum)
TOTAL KNEE ADMISSION H&P  Patient is being admitted for right total knee arthroplasty.  Subjective:  Chief Complaint:right knee pain.  HPI: Karla Bishop, 57 y.o. female, has a history of pain and functional disability in the bilaterally knee due to arthritis and has failed non-surgical conservative treatments for greater than 12 weeks to includeNSAID's and/or analgesics, corticosteriod injections, viscosupplementation injections, flexibility and strengthening excercises, use of assistive devices and activity modification.  Onset of symptoms was gradual, starting >10 years ago with gradually worsening course since that time. The patient noted prior procedures on the knee to include  arthroscopy and menisectomy on the bilaterally knee(s).  Patient currently rates pain in the right knee(s) at 8 out of 10 with activity. Patient has night pain, worsening of pain with activity and weight bearing, pain that interferes with activities of daily living, pain with passive range of motion and joint swelling.  Patient has evidence of subchondral sclerosis, periarticular osteophytes and joint space narrowing by imaging studies. This patient has had significant and increasing pain in both knees but worse in the right as of late There is no active infection.  There are no active problems to display for this patient.  Past Medical History  Diagnosis Date  . PONV (postoperative nausea and vomiting)   . Sleep apnea     no cpap  . Chronic kidney disease     left kidney removed for renal cell carcimona  . Cancer     renal cell  . Arthritis     Past Surgical History  Procedure Laterality Date  . Breast surgery Bilateral     brest reduction  . Abdominal hysterectomy    . Knee arthroscopic Bilateral   . Kidney removed Left   . Removal ovaries Bilateral     at 2 diffierent times due to cysts    Prescriptions prior to admission  Medication Sig Dispense Refill  . traMADol (ULTRAM) 50 MG tablet Take 50-100  mg by mouth 3 (three) times daily as needed for pain.       Allergies  Allergen Reactions  . Percocet (Oxycodone-Acetaminophen) Other (See Comments)    Per patient "it does not agree with me, I do not like the way it makes me feel"    History  Substance Use Topics  . Smoking status: Never Smoker   . Smokeless tobacco: Not on file  . Alcohol Use: Yes     Comment: occasionally    History reviewed. No pertinent family history.   Review of Systems  Constitutional: Negative.   HENT: Negative.   Eyes: Negative.   Respiratory: Negative.   Cardiovascular: Negative.   Gastrointestinal: Negative.   Genitourinary: Negative.   Musculoskeletal: Positive for joint pain.  Skin: Negative.   Neurological: Negative.   Endo/Heme/Allergies: Negative.   Psychiatric/Behavioral: Negative.     Objective:  Physical Exam  Constitutional: She appears well-developed.  HENT:  Head: Normocephalic.  Eyes: Pupils are equal, round, and reactive to light.  Neck: Normal range of motion.  Cardiovascular: Normal rate.   Respiratory: Effort normal.  GI: Soft.  Neurological: She is alert.  Skin: Skin is warm.  Psychiatric: She has a normal mood and affect.  right knee shows intact skin - rom 5 - 110 - collaterals stable - dp 2/4 - df and pf intact  Vital signs in last 24 hours: Temp:  [97.8 F (36.6 C)] 97.8 F (36.6 C) (05/20 0557) Pulse Rate:  [66] 66 (05/20 0557) Resp:  [20] 20 (05/20 0557)  BP: (119)/(76) 119/76 mmHg (05/20 0557) SpO2:  [97 %] 97 % (05/20 0557)  Labs:   There is no weight on file to calculate BMI.   Imaging Review Plain radiographs demonstrate severe degenerative joint disease of the bilaterally knee(s). The overall alignment ismild varus. The bone quality appears to be good for age and reported activity level.  Assessment/Plan:  End stage arthritis, right knee   The patient history, physical examination, clinical judgment of the provider and imaging studies are  consistent with end stage degenerative joint disease of the right knee(s) and total knee arthroplasty is deemed medically necessary. The treatment options including medical management, injection therapy arthroscopy and arthroplasty were discussed at length. The risks and benefits of total knee arthroplasty were presented and reviewed. The risks due to aseptic loosening, infection, stiffness, patella tracking problems, thromboembolic complications and other imponderables were discussed. The patient acknowledged the explanation, agreed to proceed with the plan and consent was signed. Patient is being admitted for inpatient treatment for surgery, pain control, PT, OT, prophylactic antibiotics, VTE prophylaxis, progressive ambulation and ADL's and discharge planning. The patient is planning to be discharged home with home health services

## 2012-05-22 NOTE — Anesthesia Procedure Notes (Signed)
Procedure Name: Intubation Date/Time: 05/22/2012 7:30 AM Performed by: Elon Alas Pre-anesthesia Checklist: Patient identified, Timeout performed, Emergency Drugs available, Suction available and Patient being monitored Patient Re-evaluated:Patient Re-evaluated prior to inductionOxygen Delivery Method: Circle system utilized Preoxygenation: Pre-oxygenation with 100% oxygen Intubation Type: IV induction Ventilation: Mask ventilation without difficulty and Oral airway inserted - appropriate to patient size Laryngoscope Size: Mac and 3 Grade View: Grade III Tube type: Oral Tube size: 7.5 mm Number of attempts: 1 Airway Equipment and Method: Stylet Placement Confirmation: positive ETCO2,  breath sounds checked- equal and bilateral and ETT inserted through vocal cords under direct vision Secured at: 21 cm Tube secured with: Tape Dental Injury: Teeth and Oropharynx as per pre-operative assessment

## 2012-05-22 NOTE — Anesthesia Preprocedure Evaluation (Addendum)
Anesthesia Evaluation  Patient identified by MRN, date of birth, ID band Patient awake    Reviewed: Allergy & Precautions, H&P , NPO status , Patient's Chart, lab work & pertinent test results  History of Anesthesia Complications (+) PONV  Airway Mallampati: II TM Distance: >3 FB Neck ROM: Full    Dental   Pulmonary sleep apnea ,  breath sounds clear to auscultation        Cardiovascular Rhythm:Regular Rate:Normal     Neuro/Psych    GI/Hepatic   Endo/Other    Renal/GU      Musculoskeletal   Abdominal (+) + obese,   Peds  Hematology   Anesthesia Other Findings   Reproductive/Obstetrics                          Anesthesia Physical Anesthesia Plan  ASA: II  Anesthesia Plan: General   Post-op Pain Management:    Induction: Intravenous  Airway Management Planned: Oral ETT  Additional Equipment:   Intra-op Plan:   Post-operative Plan: Extubation in OR  Informed Consent: I have reviewed the patients History and Physical, chart, labs and discussed the procedure including the risks, benefits and alternatives for the proposed anesthesia with the patient or authorized representative who has indicated his/her understanding and acceptance.   Dental advisory given  Plan Discussed with: Surgeon and CRNA  Anesthesia Plan Comments:        Anesthesia Quick Evaluation

## 2012-05-23 ENCOUNTER — Encounter (HOSPITAL_COMMUNITY): Payer: Self-pay | Admitting: Orthopedic Surgery

## 2012-05-23 LAB — BASIC METABOLIC PANEL
CO2: 23 mEq/L (ref 19–32)
Chloride: 102 mEq/L (ref 96–112)
Creatinine, Ser: 1 mg/dL (ref 0.50–1.10)
Glucose, Bld: 134 mg/dL — ABNORMAL HIGH (ref 70–99)

## 2012-05-23 LAB — CBC
HCT: 32.9 % — ABNORMAL LOW (ref 36.0–46.0)
Hemoglobin: 11.2 g/dL — ABNORMAL LOW (ref 12.0–15.0)
MCV: 83.9 fL (ref 78.0–100.0)
Platelets: 187 10*3/uL (ref 150–400)
RBC: 3.92 MIL/uL (ref 3.87–5.11)
WBC: 7.6 10*3/uL (ref 4.0–10.5)

## 2012-05-23 MED ORDER — HYDROMORPHONE HCL PF 1 MG/ML IJ SOLN
1.0000 mg | INTRAMUSCULAR | Status: DC | PRN
  Administered 2012-05-23: 1 mg via INTRAVENOUS
  Filled 2012-05-23: qty 1

## 2012-05-23 MED ORDER — WARFARIN SODIUM 7.5 MG PO TABS
7.5000 mg | ORAL_TABLET | Freq: Once | ORAL | Status: AC
  Administered 2012-05-23: 7.5 mg via ORAL
  Filled 2012-05-23: qty 1

## 2012-05-23 NOTE — Care Management Note (Unsigned)
    Page 1 of 2   05/23/2012     2:55:54 PM   CARE MANAGEMENT NOTE 05/23/2012  Patient:  Karla Bishop, Karla Bishop   Account Number:  [id number]  Date Initiated:  05/23/2012  Documentation initiated by:  Letha Cape  Subjective/Objective Assessment:   dx s/p R Total Knee  admit- lives with spouse, family available to help.     Action/Plan:   pt eval-rec hhpt   Anticipated DC Date:  05/24/2012   Anticipated DC Plan:  HOME W HOME HEALTH SERVICES      DC Planning Services  CM consult      [facility] Choice  HOME HEALTH   Choice offered to / List presented to:  C-1 Patient   DME arranged  CPM  WALKER - ROLLING  BEDSIDE COMMODE      DME agency  [employer]     HH arranged  HH-2 PT      HH agency  [facility]   Status of service:  In process, will continue to follow Medicare Important Message given?   (If response is "NO", the following Medicare IM given date fields will be blank) Date Medicare IM given:   Date Additional Medicare IM given:    Discharge Disposition:    Per UR Regulation:    If discussed at Long Length of Stay Meetings, dates discussed:    Comments:  05/23/12 14:52 Letha Cape RN, BSN [phone] patient lives with spouse, patient would like to have hhpt with [facility], referral made to [facility] notified.  Soc will begin 24-48 hrs post discharge.  Patient has CPM, rolling walker and 3 n 1 at home already, was set up preoperatively.

## 2012-05-23 NOTE — Op Note (Signed)
NAMEGURBANI, [name]:  [id number]  MEDICAL RECORD NO.:  [id number]  LOCATION:  5N12C                        FACILITY:  [facility]  PHYSICIAN:  Burnard Bunting, M.D.    DATE OF BIRTH:  26-Mar-1955  DATE OF PROCEDURE: DATE OF DISCHARGE:                              OPERATIVE REPORT   PREOPERATIVE DIAGNOSIS:  Right knee arthritis.  POSTOPERATIVE DIAGNOSIS:  Right knee arthritis.  PROCEDURE:  Right total knee replacement.  SURGEON:  Burnard Bunting, M.D.  ASSISTANT:  Wende Neighbors, P.A.  ANESTHESIA:  General endotracheal.  ESTIMATED BLOOD LOSS:  100 mL.  TOURNIQUET TIME:  140 minutes at 300 mmHg.  INDICATION:  Karla Bishop is a patient with end-stage right knee arthritis, presents for operative management after explanation of risks and benefits.  COMPONENTS UTILIZED:  St[employer]ker Triathlon posterior cruciate sacrificing knee replacement with 4 femur, 4 tibia, 9-mm polyethylene insert, 29-mm patella, 3 peg polyethylene.  PROCEDURE IN DETAIL:  The patient was brought to the operating room, where general endotracheal anesthesia was induced.  Preoperative antibiotics were administered.  Time-out was called.  Right leg was prescrubbed with alcohol, Betadine, allowed to air dry, prepped with DuraPrep solution and drape in a sterile manner.  Collier Flowers was used to seal the operative field.  Leg was elevated and exsanguinated with Esmarch wrap.  Tourniquet was inflated.  Anterior approach to the knee was made. Skin and subcutaneous tissues were sharply divided.  Median parapatellar approach was made.  Precise location was marked with #1 Vicryl suture. The patella was everted, fat pad partially excised.  The soft tissue elevated off the anterior distal femur.  Lateral patellofemoral ligament was released.  At this time, intramedullary alignment was used to cut the tibia, 9 mm from the least affected lateral tibial plateau.  End- stage arthritis was present in the medial  compartment and patellofemoral compartment, less arthritis present in the lateral compartment with the intramedullary alignment guide in position.  The tibia was cut perpendicular to mechanical axis.  This was a 9-mm resection.  Femur was then cut 10 mm in 5 degrees of valgus.  After this cut, the patient had full extension with a 9-mm spacer and good stability.  Chamfer and box cut was then performed.  Tibia was keel punched.  Trial reduction was then performed with trial components.  The patella was then cut freehand from 20 to 13 and replaced with a 9-mm, 3-peg patella.  With trial components in position, the patient had excellent alignment, full extension, full flexion, excellent patellar tracking with no thumbs technique.  Stability and varus valgus stress had 0, 30 and 90 degrees. Trial components were removed.  Knee joint was thoroughly irrigated. Exparel was then injected into the posterior and anterior capsule.  Knee joint was again irrigated and true components were cemented into position.  Excess cement was removed.  Same stability parameters were maintained.  Tourniquet was released.  Bleeding points were encountered and controlled with electrocautery.  Two liters of pouring irrigation solution then utilized.  The capsular incision and skin edges were then anesthetized with a combination of Marcaine, morphine and clonidine. Incision was then closed using interrupted inverted #  1 Vicryl suture. Reapproximated the arthrotomy.  Subcu and skin was closed using interrupted inverted 0 Vicryl suture, 2-0 Vicryl suture and skin staples.  The patient tolerated the procedure well without immediate complication.  Bulky dressing and knee immobilizer were placed.  Velna Hatchet Vernon's assistance was required at all times during the case for opening, closing, limb positioning, cement removal.  Her assistance was a medical necessity.     Burnard Bunting, M.D.     GSD/MEDQ  D:  05/22/2012   T:  05/23/2012  Job:  [id number]

## 2012-05-23 NOTE — Progress Notes (Signed)
UR COMPLETED  

## 2012-05-23 NOTE — Progress Notes (Signed)
Physical Therapy Evaluation Patient Details Name: Karla Bishop MRN: [id number] DOB: 1955-06-03 Today's Date: 05/23/2012 Time: 4098-1191 PT Time Calculation (min): 33 min  PT Assessment / Plan / Recommendation Clinical Impression  Pt is 57 yo female s/p right TKA who is experiencing acute pain and weakness as well as difficulty walking, acute PT to follow to address these issues and maximize independence for d/c home. Recommend HHPT at d/c.    PT Assessment  Patient needs continued PT services    Follow Up Recommendations  Home health PT;Supervision - Intermittent    Does the patient have the potential to tolerate intense rehabilitation      Barriers to Discharge None      Equipment Recommendations  Other (comment) (3-in-1)    Recommendations for Other Services     Frequency 7X/week    Precautions / Restrictions Precautions Precautions: Knee Precaution Comments: reviewed proper positioning Required Braces or Orthoses: Knee Immobilizer - Right Knee Immobilizer - Right: On when out of bed or walking Restrictions Weight Bearing Restrictions: Yes RLE Weight Bearing: Weight bearing as tolerated   Pertinent Vitals/Pain 4/10 right knee pain, premedicated      Mobility  Bed Mobility Bed Mobility: Supine to Sit;Sitting - Scoot to Edge of Bed Supine to Sit: 4: Min assist;With rails Sitting - Scoot to [place] of Bed: 4: Min assist Details for Bed Mobility Assistance: RLE supported during supine to sit and scooting. vc's for sequencing and getting hips to edge of bed Transfers Transfers: Sit to Stand;Stand to Sit Sit to Stand: 4: Min assist;From bed;With upper extremity assist Stand to Sit: 4: Min assist;To chair/3-in-1;With upper extremity assist Details for Transfer Assistance: vc's for hand placement and WB'ing as she began to stand, min A to steady Ambulation/Gait Ambulation/Gait Assistance: 4: Min assist Ambulation Distance (Feet): 16 Feet Assistive device: Rolling  walker Ambulation/Gait Assistance Details: vc's for sequencing and use of RW Gait Pattern: Step-to pattern Gait velocity: decreased Stairs: No Wheelchair Mobility Wheelchair Mobility: No    Exercises Total Joint Exercises Ankle Circles/Pumps: AROM;Both;15 reps;Seated Quad Sets: AROM;Right;10 reps;Seated   PT Diagnosis: Difficulty walking;Abnormality of gait;Acute pain  PT Problem List: Decreased strength;Decreased range of motion;Decreased activity tolerance;Decreased mobility;Decreased knowledge of use of DME;Decreased knowledge of precautions;Pain PT Treatment Interventions: DME instruction;Gait training;Functional mobility training;Therapeutic activities;Therapeutic exercise;Patient/family education   PT Goals Acute Rehab PT Goals PT Goal Formulation: With patient Time For Goal Achievement: 05/30/12 Potential to Achieve Goals: Good Pt will go Supine/Side to Sit: with modified independence PT Goal: Supine/Side to Sit - Progress: Goal set today Pt will go Sit to Supine/Side: with modified independence PT Goal: Sit to Supine/Side - Progress: Goal set today Pt will go Sit to Stand: with modified independence PT Goal: Sit to Stand - Progress: Goal set today Pt will go Stand to Sit: with modified independence PT Goal: Stand to Sit - Progress: Goal set today Pt will Ambulate: >150 feet;with modified independence;with rolling walker PT Goal: Ambulate - Progress: Goal set today Pt will Perform Home Exercise Program: with supervision, verbal cues required/provided PT Goal: Perform Home Exercise Program - Progress: Goal set today  Visit Information  Last PT Received On: 05/23/12 Assistance Needed: +1 PT/OT Co-Evaluation/Treatment: Yes    Subjective Data  Subjective: pt feeling much better now than she was this morning when I helped her into CPM Patient Stated Goal: return home   Prior Functioning  Home Living Lives With: Spouse Available Help at Discharge: Family;Available  PRN/intermittently Type of Home: House Home Access:  Level entry Home Layout: One level Bathroom Shower/Tub: Walk-in shower;Tub/shower unit Bathroom Toilet: Standard Bathroom Accessibility: Yes How Accessible: Accessible via walker Home Adaptive Equipment: Walker - rolling (raised toilet seat without rails) Additional Comments: husband will be home forst 4 days and then daughter-in-law can stay with her while husband working Prior Function Level of Independence: Independent Able to Take Stairs?: Yes Driving: Yes Vocation: Full time employment Comments: mostly sitting at work but has some walking and stairs Communication Communication: No difficulties Dominant Hand: Right    Cognition  Cognition Arousal/Alertness: Awake/alert Behavior During Therapy: WFL for tasks assessed/performed Overall Cognitive Status: Within Functional Limits for tasks assessed    Extremity/Trunk Assessment Right Upper Extremity Assessment RUE ROM/Strength/Tone: [facility] for tasks assessed Left Upper Extremity Assessment LUE ROM/Strength/Tone: WFL for tasks assessed Right Lower Extremity Assessment RLE ROM/Strength/Tone: Deficits RLE ROM/Strength/Tone Deficits: hip flex 2-/5, quad 1/5, ankle WFL RLE Sensation: WFL - Light Touch Left Lower Extremity Assessment LLE ROM/Strength/Tone: Within functional levels LLE Sensation: WFL - Light Touch;WFL - Proprioception LLE Coordination: WFL - gross motor Trunk Assessment Trunk Assessment: Normal   Balance Balance Balance Assessed: Yes Static Standing Balance Static Standing - Balance Support: Left upper extremity supported;During functional activity Static Standing - Level of Assistance: 5: Stand by assistance  End of Session PT - End of Session Equipment Utilized During Treatment: Gait belt;Right knee immobilizer Activity Tolerance: Patient tolerated treatment well Patient left: in chair;with call bell/phone within reach;with family/visitor present Nurse  Communication: Mobility status CPM Right Knee CPM Right Knee: Off Right Knee Flexion (Degrees): 40 Right Knee Extension (Degrees): 5  GP   [facility], PT  Acute Rehab Services  [phone]   Lyanne Co 05/23/2012, 11:15 AM

## 2012-05-23 NOTE — Evaluation (Addendum)
Occupational Therapy Evaluation Patient Details Name: Karla Bishop MRN: [id number] DOB: 1955-07-06 Today's Date: 05/23/2012 Time: 1027-1059 OT Time Calculation (min): 32 min  OT Assessment / Plan / Recommendation Clinical Impression    Pt is 57 yo female s/p right TKA who presents with below problem list. Pt will benefit from acute OT to follow to maximize independence for d/c home. Recommend HHOT at d/c.     OT Assessment  Patient needs continued OT Services    Follow Up Recommendations  Home health OT;Supervision/Assistance - 24 hour    Barriers to Discharge      Equipment Recommendations  3 in 1 bedside comode    Recommendations for Other Services    Frequency  Min 2X/week    Precautions / Restrictions Precautions Precautions: Knee Precaution Comments: reviewed proper positioning Required Braces or Orthoses: Knee Immobilizer - Right Knee Immobilizer - Right: On when out of bed or walking Restrictions Weight Bearing Restrictions: Yes RLE Weight Bearing: Weight bearing as tolerated   Pertinent Vitals/Pain Pain 4/10 right knee pain, premedicated.     ADL  Eating/Feeding: Independent Where Assessed - Eating/Feeding: Chair Grooming: Min guard;Wash/dry hands;Teeth care;Performed Where Assessed - Grooming: Supported standing Upper Body Bathing: Set up Where Assessed - Upper Body Bathing: Supported sitting Lower Body Bathing: Moderate assistance Where Assessed - Lower Body Bathing: Supported sit to stand Upper Body Dressing: Set up Where Assessed - Upper Body Dressing: Supported sitting Lower Body Dressing: Moderate assistance Where Assessed - Lower Body Dressing: Supported sit to Pharmacist, hospital: Minimal assistance;Min Pension scheme manager Method: Sit to stand (Minguard-sit to stand and Min A-stand to sit to assist LE.) Toilet Transfer Equipment: Raised toilet seat with arms (or 3-in-1 over toilet) Toileting - Clothing Manipulation and Hygiene: Minimal  assistance;Supervision/safety (Min A-clothing and Supervision-hygiene) Where Assessed - Toileting Clothing Manipulation and Hygiene: Standing;Sit on 3-in-1 or toilet (clothing-standing and hygiene-sitting) Tub/Shower Transfer Method: Not assessed Equipment Used: Gait belt;Knee Immobilizer;Rolling walker ADL Comments: Pt unable to reach feet to don/doff either sock. Pt at overall Mod A level for LB ADLs.     OT Diagnosis: Acute pain  OT Problem List: Decreased strength;Decreased range of motion;Decreased activity tolerance;Impaired balance (sitting and/or standing);Decreased knowledge of use of DME or AE;Decreased knowledge of precautions;Pain OT Treatment Interventions: Self-care/ADL training;DME and/or AE instruction;Therapeutic activities;Patient/family education;Balance training   OT Goals Acute Rehab OT Goals OT Goal Formulation: With patient Time For Goal Achievement: 05/30/12 Potential to Achieve Goals: Good ADL Goals Pt Will Perform Grooming: with modified independence;Standing at sink ADL Goal: Grooming - Progress: Goal set today Pt Will Perform Lower Body Bathing: Sit to stand from chair;with modified independence ADL Goal: Lower Body Bathing - Progress: Goal set today Pt Will Perform Lower Body Dressing: Sit to stand from bed;Sit to stand from chair;with modified independence ADL Goal: Lower Body Dressing - Progress: Goal set today Pt Will Transfer to Toilet: with modified independence;Ambulation;with DME ADL Goal: Toilet Transfer - Progress: Goal set today Pt Will Perform Toileting - Clothing Manipulation: Standing;with modified independence ADL Goal: Toileting - Clothing Manipulation - Progress: Goal set today Pt Will Perform Toileting - Hygiene: with modified independence;Sit to stand from 3-in-1/toilet;Sitting on 3-in-1 or toilet ADL Goal: Toileting - Hygiene - Progress: Goal set today Pt Will Perform Tub/Shower Transfer: Shower transfer;with modified  independence;Ambulation;with DME ADL Goal: Tub/Shower Transfer - Progress: Goal set today  Visit Information  Last OT Received On: 05/23/12 Assistance Needed: +1 PT/OT Co-Evaluation/Treatment: Yes    Subjective Data  Prior Functioning     Home Living Lives With: Spouse Available Help at Discharge: Family;Available PRN/intermittently Type of Home: House Home Access: Level entry Home Layout: One level Bathroom Shower/Tub: Walk-in shower;Tub/shower unit Bathroom Toilet: Standard Bathroom Accessibility: Yes How Accessible: Accessible via walker Home Adaptive Equipment: Walker - rolling (raised toilet seat without rails) Additional Comments: husband will be home forst 4 days and then daughter-in-law can stay with her while husband working Prior Function Level of Independence: Independent Able to Take Stairs?: Yes Driving: Yes Vocation: Full time employment Communication Communication: No difficulties Dominant Hand: Right         Vision/Perception     Cognition  Cognition Arousal/Alertness: Awake/alert Behavior During Therapy: WFL for tasks assessed/performed Overall Cognitive Status: Within Functional Limits for tasks assessed    Extremity/Trunk Assessment Right Upper Extremity Assessment RUE ROM/Strength/Tone: [facility] for tasks assessed Left Upper Extremity Assessment LUE ROM/Strength/Tone: WFL for tasks assessed     Mobility Bed Mobility Bed Mobility: Supine to Sit;Sitting - Scoot to Edge of Bed Supine to Sit: 4: Min assist;With rails Sitting - Scoot to [place] of Bed: 4: Min assist Details for Bed Mobility Assistance: RLE supported during supine to sit and scooting. vc's for sequencing and getting hips to edge of bed Transfers Transfers: Sit to Stand;Stand to Sit Sit to Stand: 4: Min assist;From bed;With upper extremity assist;From chair/3-in-1;4: Min guard (Minguard-sit to stand from 3 in 1) Stand to Sit: 4: Min assist;To chair/3-in-1;With upper extremity  assist Details for Transfer Assistance: vc's for hand placement and WB'ing as she began to stand, min A to steady. Cues for placement of RLE when sitting (Min A to help with this as she sat on 3 in 1).     Exercise     Balance     End of Session OT - End of Session Equipment Utilized During Treatment: Gait belt;Right knee immobilizer Activity Tolerance: Patient limited by pain Patient left: in chair;with call bell/phone within reach;with family/visitor present  GO     Earlie Raveling OTR/L [phone] 05/23/2012, 2:16 PM

## 2012-05-23 NOTE — Progress Notes (Signed)
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 Percocet (Oxycodone-Acetaminophen) Other (See Comments)    Per patient "it does not agree with me, I do not like the way it makes me feel"    Patient Measurements: Height: 5\' 3"  (160 cm) Weight: 212 lb 9.6 oz (96.435 kg) IBW/kg (Calculated) : 52.4  Vital Signs: Temp: 99.2 F (37.3 C) (05/21 0542) BP: 130/59 mmHg (05/21 0542) Pulse Rate: 81 (05/21 0542)  Labs:  Recent Labs  05/23/12 0515  HGB 11.2*  HCT 32.9*  PLT 187  LABPROT 13.7  INR 1.06  CREATININE 1.00    Estimated Creatinine Clearance: 69.4 ml/min (by C-G formula based on Cr of 1).  Assessment:  POD # 1 right TKA  Coumadin begun with 7.5 mg last night.  Has SCDs.  Goal of Therapy:  INR 2-3 Monitor platelets by anticoagulation protocol: Yes   Plan:   Repeat Coumadin 7.5 mg today.  Daily PT/INR.  Coumadin education prior to discharge.  Dennie Fetters, RPh Pager: [phone] 05/23/2012,12:35 PM

## 2012-05-23 NOTE — Progress Notes (Signed)
Subjective: Pt stable - c/o pain   Objective: Vital signs in last 24 hours: Temp:  [97.4 F (36.3 C)-99.2 F (37.3 C)] 99.2 F (37.3 C) (05/21 0542) Pulse Rate:  [64-94] 81 (05/21 0542) Resp:  [9-20] 16 (05/21 0542) BP: (103-133)/(52-72) 130/59 mmHg (05/21 0542) SpO2:  [93 %-100 %] 100 % (05/21 0542) Weight:  [96.435 kg (212 lb 9.6 oz)] 96.435 kg (212 lb 9.6 oz) (05/20 1515)  Intake/Output from previous day: 05/20 0701 - 05/21 0700 In: 1700 [I.V.:1700] Out: 3800 [Urine:3700; Blood:100] Intake/Output this shift: Total I/O In: -  Out: 3100 [Urine:3100]  Exam:  Sensation intact distally Intact pulses distally Dorsiflexion/Plantar flexion intact  Labs:  Recent Labs  05/23/12 0515  HGB 11.2*    Recent Labs  05/23/12 0515  WBC 7.6  RBC 3.92  HCT 32.9*  PLT 187    Recent Labs  05/23/12 0515  NA 135  K 4.0  CL 102  CO2 23  BUN 16  CREATININE 1.00  GLUCOSE 134*  CALCIUM 8.3*    Recent Labs  05/23/12 0515  INR 1.06    Assessment/Plan: Plan to dc pca - add dilaudid - trapeze - decrease cpm to 40 - reg diet   Karla Bishop 05/23/2012, 6:41 AM

## 2012-05-23 NOTE — Progress Notes (Signed)
PT is recommending home with HH and not SNF. CSW will make CM aware. Clinical Social Worker will sign off for now as social work intervention is no longer needed. Please consult us again if new need arises.   Isayah Ignasiak, MSW [phone] 

## 2012-05-23 NOTE — Progress Notes (Signed)
Physical Therapy Treatment Patient Details Name: Karla Bishop MRN: [id number] DOB: 1955-08-20 Today's Date: 05/23/2012 Time: 0865-7846 PT Time Calculation (min): 30 min  PT Assessment / Plan / Recommendation Comments on Treatment Session  Pt is progressing with mobility, ambulated 51' with RW and min-guard A. Pt was left in CPM 0-45 degrees. PT will continue to follow.    Follow Up Recommendations  Home health PT;Supervision - Intermittent     Does the patient have the potential to tolerate intense rehabilitation     Barriers to Discharge        Equipment Recommendations  Other (comment) (3-in-1)    Recommendations for Other Services    Frequency 7X/week   Plan Discharge plan remains appropriate;Frequency remains appropriate    Precautions / Restrictions Precautions Precautions: Knee Precaution Comments: reviewed proper positioning Required Braces or Orthoses: Knee Immobilizer - Right Knee Immobilizer - Right: On when out of bed or walking Restrictions Weight Bearing Restrictions: Yes RLE Weight Bearing: Weight bearing as tolerated   Pertinent Vitals/Pain 4/10 right knee, premedicated but requested 2nd pain pill, RN notified    Mobility  Bed Mobility Bed Mobility: Sit to Supine Supine to Sit: 4: Min assist;With rails Sitting - Scoot to Edge of Bed: 4: Min assist Sit to Supine: 4: Min assist Details for Bed Mobility Assistance: min A to RLE for return into bed and into CPM Transfers Transfers: Sit to Stand;Stand to Sit Sit to Stand: 4: Min assist;With upper extremity assist;From chair/3-in-1 Stand to Sit: 4: Min assist;To chair/3-in-1;To bed;With upper extremity assist Details for Transfer Assistance: unsteady with switching hands from stable surface to RW, min A needed for safety Ambulation/Gait Ambulation/Gait Assistance: 4: Min guard Ambulation Distance (Feet): 45 Feet Assistive device: Rolling walker Ambulation/Gait Assistance Details: vc's to roll RW and not  pick it up as well as staying close to RW and upright posture Gait Pattern: Step-to pattern Gait velocity: decreased Stairs: No Wheelchair Mobility Wheelchair Mobility: No    Exercises Total Joint Exercises Ankle Circles/Pumps: AROM;Both;15 reps;Seated Quad Sets: AROM;Right;10 reps;Seated Straight Leg Raises: AAROM;Right;5 reps;Seated Long Arc Quad: AAROM;Right;10 reps;Seated   PT Diagnosis:    PT Problem List:   PT Treatment Interventions:     PT Goals Acute Rehab PT Goals PT Goal Formulation: With patient Time For Goal Achievement: 05/30/12 Potential to Achieve Goals: Good Pt will go Supine/Side to Sit: with modified independence PT Goal: Supine/Side to Sit - Progress: Progressing toward goal Pt will go Sit to Supine/Side: with modified independence PT Goal: Sit to Supine/Side - Progress: Progressing toward goal Pt will go Sit to Stand: with modified independence PT Goal: Sit to Stand - Progress: Progressing toward goal Pt will go Stand to Sit: with modified independence PT Goal: Stand to Sit - Progress: Progressing toward goal Pt will Ambulate: >150 feet;with modified independence;with rolling walker PT Goal: Ambulate - Progress: Progressing toward goal Pt will Perform Home Exercise Program: with supervision, verbal cues required/provided PT Goal: Perform Home Exercise Program - Progress: Progressing toward goal  Visit Information  Last PT Received On: 05/23/12 Assistance Needed: +1    Subjective Data  Subjective: pt ready to get up and move again, knee feels stiff after sitting awhile Patient Stated Goal: return home   Cognition  Cognition Arousal/Alertness: Awake/alert Behavior During Therapy: WFL for tasks assessed/performed Overall Cognitive Status: Within Functional Limits for tasks assessed    Balance  Balance Balance Assessed: No  End of Session PT - End of Session Equipment Utilized During Treatment: Gait  belt;Right knee immobilizer Activity Tolerance:  Patient tolerated treatment well Patient left: in bed;in CPM;with call bell/phone within reach;with family/visitor present Nurse Communication: Mobility status CPM 0-45   GP   Lyanne Co, PT  Acute Rehab Services  [phone]   Lyanne Co 05/23/2012, 3:43 PM

## 2012-05-24 LAB — PROTIME-INR
INR: 1.22 (ref 0.00–1.49)
Prothrombin Time: 15.2 seconds (ref 11.6–15.2)

## 2012-05-24 LAB — CBC
HCT: 29.7 % — ABNORMAL LOW (ref 36.0–46.0)
MCHC: 34 g/dL (ref 30.0–36.0)
Platelets: 146 10*3/uL — ABNORMAL LOW (ref 150–400)
RDW: 13.4 % (ref 11.5–15.5)
WBC: 6.5 10*3/uL (ref 4.0–10.5)

## 2012-05-24 MED ORDER — WARFARIN SODIUM 5 MG PO TABS: 5.0000 mg | ORAL_TABLET | Freq: Every day | ORAL | Status: DC

## 2012-05-24 MED ORDER — HYDROCODONE-ACETAMINOPHEN 10-325 MG PO TABS: 1.0000 | ORAL_TABLET | ORAL | Status: DC | PRN

## 2012-05-24 MED ORDER — WARFARIN SODIUM 7.5 MG PO TABS
7.5000 mg | ORAL_TABLET | Freq: Once | ORAL | Status: AC
  Administered 2012-05-24: 7.5 mg via ORAL
  Filled 2012-05-24: qty 1

## 2012-05-24 MED ORDER — METHOCARBAMOL 500 MG PO TABS: 500.0000 mg | ORAL_TABLET | Freq: Four times a day (QID) | ORAL | Status: DC | PRN

## 2012-05-24 MED ORDER — DSS 100 MG PO CAPS: 100.0000 mg | ORAL_CAPSULE | Freq: Two times a day (BID) | ORAL | Status: DC

## 2012-05-24 NOTE — Progress Notes (Signed)
Subjective: Pt stable - pain controlled - tol cpm   Objective: Vital signs in last 24 hours: Temp:  [98.1 F (36.7 C)-100.1 F (37.8 C)] 100 F (37.8 C) (05/22 0545) Pulse Rate:  [83-96] 96 (05/22 0545) Resp:  [16-18] 18 (05/22 0545) BP: (118-141)/(50-64) 141/64 mmHg (05/22 0545) SpO2:  [95 %-100 %] 95 % (05/22 0545)  Intake/Output from previous day: 05/21 0701 - 05/22 0700 In: 240 [P.O.:240] Out: -  Intake/Output this shift:    Exam:  Neurovascular intact Sensation intact distally Intact pulses distally  Labs:  Recent Labs  05/23/12 0515 05/24/12 0505  HGB 11.2* 10.1*    Recent Labs  05/23/12 0515 05/24/12 0505  WBC 7.6 6.5  RBC 3.92 3.51*  HCT 32.9* 29.7*  PLT 187 146*    Recent Labs  05/23/12 0515  NA 135  K 4.0  CL 102  CO2 23  BUN 16  CREATININE 1.00  GLUCOSE 134*  CALCIUM 8.3*    Recent Labs  05/23/12 0515 05/24/12 0505  INR 1.06 1.22    Assessment/Plan: Plan pt and cpm today - change dressing tomorrow - possible dc fri vs sat   DEAN,GREGORY SCOTT 05/24/2012, 7:38 AM

## 2012-05-24 NOTE — Progress Notes (Signed)
Occupational Therapy Treatment Patient Details Name: Karla Bishop MRN: [id number] DOB: 1955-08-18 Today's Date: 05/24/2012 Time: 2951-8841 OT Time Calculation (min): 37 min  OT Assessment / Plan / Recommendation Comments on Treatment Session Pt progressing toward goals. Practiced simulated shower transfer and LB dressing today.     Follow Up Recommendations  Home health OT;Supervision/Assistance - 24 hour    Barriers to Discharge       Equipment Recommendations  3 in 1 bedside comode    Recommendations for Other Services    Frequency Min 2X/week   Plan Discharge plan remains appropriate    Precautions / Restrictions Precautions Precautions: Knee Required Braces or Orthoses: Knee Immobilizer - Right Knee Immobilizer - Right: On when out of bed or walking Restrictions Weight Bearing Restrictions: Yes RLE Weight Bearing: Weight bearing as tolerated   Pertinent Vitals/Pain Pain 5/10 in knee. Repositioned and called for pain meds.     ADL  Lower Body Dressing: Performed;Min guard Where Assessed - Lower Body Dressing: Supported sit to stand Toilet Transfer: Hydrographic surveyor Method: Sit to Barista: Raised toilet seat with arms (or 3-in-1 over toilet) Tub/Shower Transfer: Simulated;Minimal assistance Tub/Shower Transfer Method: Science writer: Walk in shower;Other (comment) (3 in 1) Equipment Used: Gait belt;Knee Immobilizer;Rolling walker;Reacher;Sock aid;Long-handled shoe horn;Long-handled sponge ADL Comments: Pt donned underwear without AE with minguard assist. Pt practiced simulated shower transfer and required Min A to maneuver walker and also pt unable to clear threshold when stepping back  to 3 in 1, however able to clear it when stepping back over threshold.    OT Diagnosis:    OT Problem List:   OT Treatment Interventions:     OT Goals Acute Rehab OT Goals OT Goal Formulation: With patient Time For Goal  Achievement: 05/30/12 Potential to Achieve Goals: Good ADL Goals Pt Will Perform Grooming: with modified independence;Standing at sink Pt Will Perform Lower Body Bathing: Sit to stand from chair;with modified independence Pt Will Perform Lower Body Dressing: Sit to stand from bed;Sit to stand from chair;with modified independence ADL Goal: Lower Body Dressing - Progress: Progressing toward goals Pt Will Transfer to Toilet: with modified independence;Ambulation;with DME ADL Goal: Toilet Transfer - Progress: Progressing toward goals Pt Will Perform Toileting - Clothing Manipulation: Standing;with modified independence Pt Will Perform Toileting - Hygiene: with modified independence;Sit to stand from 3-in-1/toilet;Sitting on 3-in-1 or toilet Pt Will Perform Tub/Shower Transfer: Shower transfer;with modified independence;Ambulation;with DME ADL Goal: Tub/Shower Transfer - Progress: Progressing toward goals  Visit Information  Last OT Received On: 05/24/12 Assistance Needed: +1    Subjective Data      Prior Functioning       Cognition  Cognition Arousal/Alertness: Awake/alert Behavior During Therapy: WFL for tasks assessed/performed Overall Cognitive Status: Within Functional Limits for tasks assessed    Mobility  Bed Mobility Bed Mobility: Sit to Supine Sit to Supine: 4: Min assist;HOB flat Details for Bed Mobility Assistance: Min A to help RLE into bed. Transfers Transfers: Sit to Stand;Stand to Sit Sit to Stand: 4: Min guard;With upper extremity assist;From chair/3-in-1 Stand to Sit: 4: Min guard;With upper extremity assist;To chair/3-in-1;To bed Details for Transfer Assistance: Minguard for safety and placement of RLE when sitting    Exercises    Balance   End of Session OT - End of Session Equipment Utilized During Treatment: Gait belt;Right knee immobilizer Activity Tolerance: Patient tolerated treatment well Patient left: with call bell/phone within reach;in bed Nurse  Communication: Patient requests pain meds  CPM left on: 50 degrees  GO     Alyda Megna, Ardeen Jourdain 05/24/2012, 2:19 PM

## 2012-05-24 NOTE — Progress Notes (Signed)
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 Percocet (Oxycodone-Acetaminophen) Other (See Comments)    Per patient "it does not agree with me, I do not like the way it makes me feel"    Patient Measurements: Height: 5\' 3"  (160 cm) Weight: 212 lb 9.6 oz (96.435 kg) IBW/kg (Calculated) : 52.4  Vital Signs: Temp: 100 F (37.8 C) (05/22 0545) BP: 141/64 mmHg (05/22 0545) Pulse Rate: 96 (05/22 0545)  Labs:  Recent Labs  05/23/12 0515 05/24/12 0505  HGB 11.2* 10.1*  HCT 32.9* 29.7*  PLT 187 146*  LABPROT 13.7 15.2  INR 1.06 1.22  CREATININE 1.00  --     Estimated Creatinine Clearance: 69.4 ml/min (by C-G formula based on Cr of 1).  Assessment:  POD # 2 right TKA INR 1.22 after 2 doses of Coumadin h 7.5.   Has SCDs.  Goal of Therapy:  INR 2-3 Monitor platelets by anticoagulation protocol: Yes   Plan:   Repeat Coumadin 7.5 mg today.  Daily PT/INR. Herby Abraham, Pharm.D. [phone] 05/24/2012 2:29 PM

## 2012-05-24 NOTE — Progress Notes (Signed)
Physical Therapy Treatment Patient Details Name: Karla Bishop MRN: [id number] DOB: 08-07-1955 Today's Date: 05/24/2012 Time: 4098-1191 PT Time Calculation (min): 20 min  PT Assessment / Plan / Recommendation Comments on Treatment Session  Pt did better this afternoon. Pt is close to Mod. Independent level and will most likely be ready for d/c home with HHPT services tomorrow after therapy.    Follow Up Recommendations  Home health PT     Does the patient have the potential to tolerate intense rehabilitation     Barriers to Discharge        Equipment Recommendations       Recommendations for Other Services    Frequency 7X/week   Plan Discharge plan remains appropriate    Precautions / Restrictions Precautions Precautions: Knee Required Braces or Orthoses: Knee Immobilizer - Right Knee Immobilizer - Right: On when out of bed or walking Restrictions Weight Bearing Restrictions: Yes RLE Weight Bearing: Weight bearing as tolerated   Pertinent Vitals/Pain     Mobility  Bed Mobility Bed Mobility: Not assessed Supine to Sit: 4: Min assist Sitting - Scoot to Edge of Bed: 4: Min assist Details for Bed Mobility Assistance: cues for technique, therapist assist with LE management. Transfers Transfers: Sit to Stand;Stand to Sit Sit to Stand: 5: Supervision;With upper extremity assist;From chair/3-in-1 Stand to Sit: 5: Supervision;To chair/3-in-1;To toilet Details for Transfer Assistance: cues for hand placement and stepping out LE to sit. Ambulation/Gait Ambulation/Gait Assistance: 5: Supervision Ambulation Distance (Feet): 100 Feet Assistive device: Rolling walker Ambulation/Gait Assistance Details: decreased stance time on right due to increasing pain with weight shift. Gait Pattern: Step-to pattern;Decreased stride length Gait velocity: slow Stairs: No    Exercises Total Joint Exercises Ankle Circles/Pumps: AROM;Strengthening;Both;10 reps;Seated Quad Sets:  AROM;Strengthening;Right;Seated Hip ABduction/ADduction: AAROM;Strengthening;Right;10 reps;Seated Straight Leg Raises: AAROM;Strengthening;Right;10 reps;Seated Long Arc Quad: AAROM;Strengthening;Right;10 reps;Seated Knee Flexion: AAROM;Strengthening;Right;5 reps;Seated Goniometric ROM: 45*   PT Diagnosis:    PT Problem List:   PT Treatment Interventions:     PT Goals Acute Rehab PT Goals PT Goal: Supine/Side to Sit - Progress: Progressing toward goal PT Goal: Sit to Stand - Progress: Progressing toward goal PT Goal: Stand to Sit - Progress: Progressing toward goal PT Goal: Ambulate - Progress: Progressing toward goal PT Goal: Perform Home Exercise Program - Progress: Progressing toward goal  Visit Information  Last PT Received On: 05/24/12 Assistance Needed: +1    Subjective Data  Subjective: I want to walk first this time."   Cognition  Cognition Arousal/Alertness: Awake/alert Behavior During Therapy: WFL for tasks assessed/performed Overall Cognitive Status: Within Functional Limits for tasks assessed    Balance  Static Standing Balance Static Standing - Balance Support: No upper extremity supported Static Standing - Level of Assistance: 5: Stand by assistance  End of Session PT - End of Session Equipment Utilized During Treatment: Gait belt Activity Tolerance: Patient tolerated treatment well Patient left: in chair;with call bell/phone within reach Nurse Communication: Mobility status   GP     [name] 05/24/2012, 1:04 PM

## 2012-05-24 NOTE — Progress Notes (Signed)
Physical Therapy Treatment Patient Details Name: Karla Bishop MRN: [id number] DOB: [birth year] Today's Date: 05/24/2012 Time: 1324-4010 PT Time Calculation (min): 26 min  PT Assessment / Plan / Recommendation Comments on Treatment Session  Pt progressing well with therapy. Pt experiencing some nausea and overheating with therapy. Will continue to progress Independence with mobility.    Follow Up Recommendations  Home health PT     Does the patient have the potential to tolerate intense rehabilitation     Barriers to Discharge        Equipment Recommendations       Recommendations for Other Services    Frequency 7X/week   Plan Discharge plan remains appropriate    Precautions / Restrictions Precautions Precautions: Knee Required Braces or Orthoses: Knee Immobilizer - Right Knee Immobilizer - Right: On when out of bed or walking Restrictions Weight Bearing Restrictions: Yes RLE Weight Bearing: Weight bearing as tolerated   Pertinent Vitals/Pain     Mobility  Bed Mobility Bed Mobility: Supine to Sit Supine to Sit: 4: Min assist Sitting - Scoot to Edge of Bed: 4: Min assist Details for Bed Mobility Assistance: cues for technique, therapist assist with LE management. Transfers Transfers: Sit to Stand;Stand to Sit Sit to Stand: 4: Min assist Stand to Sit: 4: Min assist Details for Transfer Assistance: cues for hand placement Ambulation/Gait Ambulation/Gait Assistance: 4: Min guard Ambulation Distance (Feet): 45 Feet Assistive device: Rolling walker Ambulation/Gait Assistance Details: cues for sequencing with RW Gait Pattern: Step-to pattern Gait velocity: slow Stairs: No    Exercises Total Joint Exercises Ankle Circles/Pumps: AROM;Strengthening;Right;10 reps;Supine Quad Sets: AROM;Strengthening;Right;10 reps;Supine Hip ABduction/ADduction: AAROM;Strengthening;Right;10 reps;Supine Straight Leg Raises: AAROM;Strengthening;Right;10 reps;Supine Long Arc Quad:  AAROM;Strengthening;Right;10 reps Knee Flexion: AAROM;Strengthening;Right;5 reps;Seated Goniometric ROM: approx. 45*   PT Diagnosis:    PT Problem List:   PT Treatment Interventions:     PT Goals Acute Rehab PT Goals PT Goal: Supine/Side to Sit - Progress: Progressing toward goal PT Goal: Sit to Stand - Progress: Progressing toward goal PT Goal: Stand to Sit - Progress: Progressing toward goal PT Goal: Ambulate - Progress: Progressing toward goal PT Goal: Perform Home Exercise Program - Progress: Progressing toward goal  Visit Information  Last PT Received On: 05/24/12 Assistance Needed: +1    Subjective Data  Subjective: I am ready to try.   Cognition  Cognition Arousal/Alertness: Awake/alert Behavior During Therapy: WFL for tasks assessed/performed Overall Cognitive Status: Within Functional Limits for tasks assessed    Balance  Static Standing Balance Static Standing - Balance Support: No upper extremity supported Static Standing - Level of Assistance: 5: Stand by assistance  End of Session PT - End of Session Equipment Utilized During Treatment: Gait belt Activity Tolerance: Treatment limited secondary to medical complications (Comment) (c/o overheating with mobility and pain) Patient left: in chair;with call bell/phone within reach;with family/visitor present Nurse Communication: Mobility status   GP     [name] 05/24/2012, 10:33 AM

## 2012-05-25 LAB — CBC
HCT: 27.8 % — ABNORMAL LOW (ref 36.0–46.0)
MCHC: 34.5 g/dL (ref 30.0–36.0)
Platelets: 159 10*3/uL (ref 150–400)
RDW: 13.2 % (ref 11.5–15.5)
WBC: 6.4 10*3/uL (ref 4.0–10.5)

## 2012-05-25 LAB — PROTIME-INR
INR: 1.39 (ref 0.00–1.49)
Prothrombin Time: 16.7 seconds — ABNORMAL HIGH (ref 11.6–15.2)

## 2012-05-25 MED ORDER — WARFARIN SODIUM 5 MG PO TABS
7.5000 mg | ORAL_TABLET | Freq: Every day | ORAL | Status: DC
  Filled 2012-05-25: qty 1.5

## 2012-05-25 NOTE — Progress Notes (Signed)
Subjective: Pt stable - pain controlled   Objective: Vital signs in last 24 hours: Temp:  [98.9 F (37.2 C)-99.1 F (37.3 C)] 98.9 F (37.2 C) (05/23 0559) Pulse Rate:  [88-89] 89 (05/23 0559) Resp:  [18] 18 (05/23 0559) BP: (126)/(63-69) 126/69 mmHg (05/23 0559) SpO2:  [97 %-98 %] 98 % (05/23 0559)  Intake/Output from previous day:   Intake/Output this shift:    Exam:  Neurovascular intact Sensation intact distally Intact pulses distally  Labs:  Recent Labs  05/23/12 0515 05/24/12 0505 05/25/12 0540  HGB 11.2* 10.1* 9.6*    Recent Labs  05/24/12 0505 05/25/12 0540  WBC 6.5 6.4  RBC 3.51* 3.29*  HCT 29.7* 27.8*  PLT 146* 159    Recent Labs  05/23/12 0515  NA 135  K 4.0  CL 102  CO2 23  BUN 16  CREATININE 1.00  GLUCOSE 134*  CALCIUM 8.3*    Recent Labs  05/24/12 0505 05/25/12 0540  INR 1.22 1.39    Assessment/Plan: Plan dc today after PT   Karla Bishop 05/25/2012, 6:29 AM

## 2012-05-25 NOTE — Progress Notes (Signed)
OT Cancellation Note  Patient Details Name: MAGUADALUPE LATA MRN: [id number] DOB: 10-06-1955   Cancelled Treatment:     Pt did not have any concerns for OT prior to d/c.   Earlie Raveling OTR/L [phone] 05/25/2012, 12:17 PM

## 2012-05-25 NOTE — Progress Notes (Signed)
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 Percocet (Oxycodone-Acetaminophen) Other (See Comments)    Per patient "it does not agree with me, I do not like the way it makes me feel"    Patient Measurements: Height: 5\' 3"  (160 cm) Weight: 212 lb 9.6 oz (96.435 kg) IBW/kg (Calculated) : 52.4  Vital Signs: Temp: 98.9 F (37.2 C) (05/23 0559) Temp src: Oral (05/23 0559) BP: 126/69 mmHg (05/23 0559) Pulse Rate: 89 (05/23 0559)  Labs:  Recent Labs  05/23/12 0515 05/24/12 0505 05/25/12 0540  HGB 11.2* 10.1* 9.6*  HCT 32.9* 29.7* 27.8*  PLT 187 146* 159  LABPROT 13.7 15.2 16.7*  INR 1.06 1.22 1.39  CREATININE 1.00  --   --     Estimated Creatinine Clearance: 69.4 ml/min (by C-G formula based on Cr of 1).  Assessment:  POD # 3 right TKA INR 1.39  after 3 doses of Coumadin 7.5 mg.  Hg 9.6.   Coumadin book and video charted as completed.  Has Rx for coumadin 5 mg tablets. No bleeding reported.    Goal of Therapy:  INR 2-3 Monitor platelets by anticoagulation protocol: Yes   Plan:  Rec DC home on coumadin 7.5 mg daily - take 1 1/2 tablets of 5 mg coumadin. Rec INR check by [facility] on Monday 5/26. Herby Abraham, Pharm.D. [phone] 05/25/2012 12:08 PM

## 2012-05-25 NOTE — Progress Notes (Signed)
Physical Therapy Treatment Patient Details Name: Karla Bishop MRN: [id number] DOB: 1955/07/22 Today's Date: 05/25/2012 Time: 4403-4742 PT Time Calculation (min): 25 min  PT Assessment / Plan / Recommendation Comments on Treatment Session  Practiced curb step for 1 step to enter home.  [name] present and verbalizes understanding of technique.  Pt able to perform curb step with RW with close supervision, cuing for technique.  Pt/husband report they feel safe for d/c home.    Follow Up Recommendations  Home health PT     Does the patient have the potential to tolerate intense rehabilitation     Barriers to Discharge        Equipment Recommendations       Recommendations for Other Services    Frequency 7X/week   Plan Discharge plan remains appropriate;Frequency remains appropriate    Precautions / Restrictions Precautions Precautions: Knee Required Braces or Orthoses: Knee Immobilizer - Right Knee Immobilizer - Right: On when out of bed or walking Restrictions Weight Bearing Restrictions: Yes RLE Weight Bearing: Weight bearing as tolerated   Pertinent Vitals/Pain Pt c/o knee pain 3/10 with gait, eased with rest    Mobility  Bed Mobility Supine to Sit: 5: Supervision Sit to Supine: 4: Min assist;HOB flat Details for Bed Mobility Assistance: Min A to lift R LE into bed Transfers Sit to Stand: 5: Supervision Stand to Sit: 5: Supervision Details for Transfer Assistance: Pt able to stand from bed and recliner multiple attempts for strengthening with min A, no cues required for safe UE and LE placement. Ambulation/Gait Ambulation/Gait Assistance: 5: Supervision Ambulation Distance (Feet): 55 Feet Assistive device: Rolling walker Ambulation/Gait Assistance Details: pt continues with step to gait pattern, pain limits ability to step through Stairs: Yes Stairs Assistance: 4: Min guard Stair Management Technique: With walker Number of Stairs: 1    Exercises     PT  Diagnosis:    PT Problem List:   PT Treatment Interventions:     PT Goals Acute Rehab PT Goals PT Goal: Supine/Side to Sit - Progress: Progressing toward goal PT Goal: Sit to Supine/Side - Progress: Progressing toward goal PT Goal: Sit to Stand - Progress: Progressing toward goal PT Goal: Stand to Sit - Progress: Progressing toward goal PT Goal: Ambulate - Progress: Progressing toward goal  Visit Information  Last PT Received On: 05/25/12 Assistance Needed: +1    Subjective Data  Subjective: I'm ready to go   Cognition  Cognition Arousal/Alertness: Awake/alert Behavior During Therapy: WFL for tasks assessed/performed Overall Cognitive Status: Within Functional Limits for tasks assessed    Balance     End of Session PT - End of Session Equipment Utilized During Treatment: Gait belt;Right knee immobilizer Activity Tolerance: Patient tolerated treatment well Patient left: in bed;with call bell/phone within reach;with family/visitor present CPM Right Knee CPM Right Knee: Off   GP     Darden Flemister 05/25/2012, 11:07 AM

## 2012-05-28 NOTE — Progress Notes (Signed)
PT is recommending home with HH and not SNF.  Clinical Social Worker will sign off for now as social work intervention is no longer needed. Please consult us again if new need arises.   Lilliane Sposito, MSW [phone] 

## 2012-06-11 ENCOUNTER — Ambulatory Visit: Payer: 59 | Attending: Orthopedic Surgery | Admitting: Physical Therapy

## 2012-06-11 DIAGNOSIS — M25569 Pain in unspecified knee: Secondary | ICD-10-CM | POA: Insufficient documentation

## 2012-06-11 DIAGNOSIS — M25669 Stiffness of unspecified knee, not elsewhere classified: Secondary | ICD-10-CM | POA: Insufficient documentation

## 2012-06-11 DIAGNOSIS — R262 Difficulty in walking, not elsewhere classified: Secondary | ICD-10-CM | POA: Insufficient documentation

## 2012-06-11 DIAGNOSIS — IMO0001 Reserved for inherently not codable concepts without codable children: Secondary | ICD-10-CM | POA: Insufficient documentation

## 2012-06-11 DIAGNOSIS — R609 Edema, unspecified: Secondary | ICD-10-CM | POA: Insufficient documentation

## 2012-06-11 DIAGNOSIS — Z96659 Presence of unspecified artificial knee joint: Secondary | ICD-10-CM | POA: Insufficient documentation

## 2012-06-13 ENCOUNTER — Ambulatory Visit: Payer: 59 | Admitting: Physical Therapy

## 2012-06-15 ENCOUNTER — Ambulatory Visit: Payer: 59 | Admitting: Physical Therapy

## 2012-06-19 ENCOUNTER — Ambulatory Visit: Payer: 59 | Admitting: Physical Therapy

## 2012-06-21 ENCOUNTER — Ambulatory Visit: Payer: 59 | Admitting: Physical Therapy

## 2012-06-22 ENCOUNTER — Ambulatory Visit: Payer: 59 | Admitting: Physical Therapy

## 2012-06-26 ENCOUNTER — Ambulatory Visit: Payer: 59 | Admitting: Physical Therapy

## 2012-06-26 NOTE — Discharge Summary (Signed)
Physician Discharge Summary  Patient ID: Karla Bishop MRN: [id number] DOB/AGE: 57-Mar-1957 57 y.o.  Admit date: 05/22/2012 Discharge date:05/25/2012 Admission Diagnoses:  Right knee arthritis Discharge Diagnoses:  Same  Surgeries: Procedure(s): TOTAL KNEE ARTHROPLASTY- right on 05/22/2012   Consultants:    Discharged Condition: Stable  Hospital Course: Karla Bishop is an 57 y.o. female who was admitted 05/22/2012 with a chief complaint of right knee pain, and found to have a diagnosis of right knee arthritis.  They were brought to the operating room on 05/22/2012 and underwent the above named procedures. She tolerated PT well and was ambulating in the hall by time of dc.   Antibiotics given:  Anti-infectives   Start     Dose/Rate Route Frequency Ordered Stop   05/22/12 1600  ceFAZolin (ANCEF) IVPB 2 g/50 mL premix     2 g 100 mL/hr over 30 Minutes Intravenous 3 times per day 05/22/12 1535 05/23/12 0111   05/22/12 0600  ceFAZolin (ANCEF) IVPB 2 g/50 mL premix     2 g 100 mL/hr over 30 Minutes Intravenous On call to O.R. 05/21/12 1354 05/22/12 0739    .  Recent vital signs:  Filed Vitals:   05/25/12 0800  BP:   Pulse:   Temp:   Resp: 18    Recent laboratory studies:  Results for orders placed during the hospital encounter of 05/22/12  SURGICAL PCR SCREEN      Result Value Range   MRSA, PCR NEGATIVE  NEGATIVE   Staphylococcus aureus NEGATIVE  NEGATIVE  URINE CULTURE      Result Value Range   Specimen Description URINE, CLEAN CATCH     Special Requests NONE     Culture  Setup Time 05/15/2012 02:03     Colony Count NO GROWTH     Culture NO GROWTH     Report Status 05/15/2012 FINAL    BASIC METABOLIC PANEL      Result Value Range   Sodium 141  135 - 145 mEq/L   Potassium 3.9  3.5 - 5.1 mEq/L   Chloride 107  96 - 112 mEq/L   CO2 23  19 - 32 mEq/L   Glucose, Bld 98  70 - 99 mg/dL   BUN 20  6 - 23 mg/dL   Creatinine, Ser 4.09 (*) 0.50 - 1.10 mg/dL   Calcium 9.9   8.4 - 81.1 mg/dL   GFR calc non Af Amer 48 (*) >90 mL/min   GFR calc Af Amer 55 (*) >90 mL/min  CBC      Result Value Range   WBC 6.4  4.0 - 10.5 K/uL   RBC 4.81  3.87 - 5.11 MIL/uL   Hemoglobin 14.0  12.0 - 15.0 g/dL   HCT 91.4  78.2 - 95.6 %   MCV 82.7  78.0 - 100.0 fL   MCH 29.1  26.0 - 34.0 pg   MCHC 35.2  30.0 - 36.0 g/dL   RDW 21.3  08.6 - 57.8 %   Platelets 225  150 - 400 K/uL  URINALYSIS, ROUTINE W REFLEX MICROSCOPIC      Result Value Range   Color, Urine YELLOW  YELLOW   APPearance CLEAR  CLEAR   Specific Gravity, Urine 1.025  1.005 - 1.030   pH 5.0  5.0 - 8.0   Glucose, UA NEGATIVE  NEGATIVE mg/dL   Hgb urine dipstick NEGATIVE  NEGATIVE   Bilirubin Urine NEGATIVE  NEGATIVE   Ketones, ur NEGATIVE  NEGATIVE mg/dL  Protein, ur NEGATIVE  NEGATIVE mg/dL   Urobilinogen, UA 1.0  0.0 - 1.0 mg/dL   Nitrite NEGATIVE  NEGATIVE   Leukocytes, UA SMALL (*) NEGATIVE  PROTIME-INR      Result Value Range   Prothrombin Time 12.1  11.6 - 15.2 seconds   INR 0.90  0.00 - 1.49  APTT      Result Value Range   aPTT 28  24 - 37 seconds  URINE MICROSCOPIC-ADD ON      Result Value Range   Squamous Epithelial / LPF MANY (*) RARE   WBC, UA 0-2  <3 WBC/hpf  PROTIME-INR      Result Value Range   Prothrombin Time 13.7  11.6 - 15.2 seconds   INR 1.06  0.00 - 1.49  CBC      Result Value Range   WBC 7.6  4.0 - 10.5 K/uL   RBC 3.92  3.87 - 5.11 MIL/uL   Hemoglobin 11.2 (*) 12.0 - 15.0 g/dL   HCT 16.1 (*) 09.6 - 04.5 %   MCV 83.9  78.0 - 100.0 fL   MCH 28.6  26.0 - 34.0 pg   MCHC 34.0  30.0 - 36.0 g/dL   RDW 40.9  81.1 - 91.4 %   Platelets 187  150 - 400 K/uL  BASIC METABOLIC PANEL      Result Value Range   Sodium 135  135 - 145 mEq/L   Potassium 4.0  3.5 - 5.1 mEq/L   Chloride 102  96 - 112 mEq/L   CO2 23  19 - 32 mEq/L   Glucose, Bld 134 (*) 70 - 99 mg/dL   BUN 16  6 - 23 mg/dL   Creatinine, Ser 7.82  0.50 - 1.10 mg/dL   Calcium 8.3 (*) 8.4 - 10.5 mg/dL   GFR calc non Af Amer  62 (*) >90 mL/min   GFR calc Af Amer 72 (*) >90 mL/min  PROTIME-INR      Result Value Range   Prothrombin Time 15.2  11.6 - 15.2 seconds   INR 1.22  0.00 - 1.49  CBC      Result Value Range   WBC 6.5  4.0 - 10.5 K/uL   RBC 3.51 (*) 3.87 - 5.11 MIL/uL   Hemoglobin 10.1 (*) 12.0 - 15.0 g/dL   HCT 95.6 (*) 21.3 - 08.6 %   MCV 84.6  78.0 - 100.0 fL   MCH 28.8  26.0 - 34.0 pg   MCHC 34.0  30.0 - 36.0 g/dL   RDW 57.8  46.9 - 62.9 %   Platelets 146 (*) 150 - 400 K/uL  PROTIME-INR      Result Value Range   Prothrombin Time 16.7 (*) 11.6 - 15.2 seconds   INR 1.39  0.00 - 1.49  CBC      Result Value Range   WBC 6.4  4.0 - 10.5 K/uL   RBC 3.29 (*) 3.87 - 5.11 MIL/uL   Hemoglobin 9.6 (*) 12.0 - 15.0 g/dL   HCT 52.8 (*) 41.3 - 24.4 %   MCV 84.5  78.0 - 100.0 fL   MCH 29.2  26.0 - 34.0 pg   MCHC 34.5  30.0 - 36.0 g/dL   RDW 01.0  27.2 - 53.6 %   Platelets 159  150 - 400 K/uL  TYPE AND SCREEN      Result Value Range   ABO/RH(D) O POS     Antibody Screen NEG     Sample Expiration  05/28/2012      Discharge Medications:     Medication List    STOP taking these medications       traMADol 50 MG tablet  Commonly known as:  ULTRAM      TAKE these medications       DSS 100 MG Caps  Take 100 mg by mouth 2 (two) times daily.     HYDROcodone-acetaminophen 10-325 MG per tablet  Commonly known as:  NORCO  Take 1 tablet by mouth every 4 (four) hours as needed (breakthrough pain).     methocarbamol 500 MG tablet  Commonly known as:  ROBAXIN  Take 1 tablet (500 mg total) by mouth every 6 (six) hours as needed.     warfarin 5 MG tablet  Commonly known as:  COUMADIN  Take 1 tablet (5 mg total) by mouth daily.        Diagnostic Studies: No results found.  Disposition: 01-Home or Self Care      Discharge Orders   Future Appointments Provider Department Dept Phone   06/28/2012 2:15 PM Stacie Glaze, PT [facility] [place] [phone]   06/29/2012  8:45 AM Stacie Glaze, PT [facility] [place] [phone]   07/03/2012 1:00 PM Merry Proud, PT [facility] [phone]   07/05/2012 1:30 PM Stacie Glaze, PT [facility] [phone]   Future Orders Complete By Expires     Call MD / Call 911  As directed     Comments:      If you experience chest pain or shortness of breath, CALL 911 and be transported to the hospital emergency room.  If you develope a fever above 101 F, pus (white drainage) or increased drainage or redness at the wound, or calf pain, call your surgeon's office.    Constipation Prevention  As directed     Comments:      Drink plenty of fluids.  Prune juice may be helpful.  You may use a stool softener, such as Colace (over the counter) 100 mg twice a day.  Use MiraLax (over the counter) for constipation as needed.    Diet - low sodium heart healthy  As directed     Discharge instructions  As directed     Comments:      1. CPM 6 hours per day - increase as tolerated 2. Keep incision dry  3. Weight bearing as tolerated with crutches    Full weight bearing  As directed     Increase activity slowly as tolerated  As directed           Signed: DEAN,GREGORY SCOTT 06/26/2012, 2:48 PM

## 2012-06-28 ENCOUNTER — Ambulatory Visit: Payer: 59 | Admitting: Physical Therapy

## 2012-06-29 ENCOUNTER — Ambulatory Visit: Payer: 59 | Admitting: Physical Therapy

## 2012-07-03 ENCOUNTER — Ambulatory Visit: Payer: 59 | Attending: Orthopedic Surgery | Admitting: Physical Therapy

## 2012-07-03 DIAGNOSIS — Z96659 Presence of unspecified artificial knee joint: Secondary | ICD-10-CM | POA: Insufficient documentation

## 2012-07-03 DIAGNOSIS — M25669 Stiffness of unspecified knee, not elsewhere classified: Secondary | ICD-10-CM | POA: Insufficient documentation

## 2012-07-03 DIAGNOSIS — R609 Edema, unspecified: Secondary | ICD-10-CM | POA: Insufficient documentation

## 2012-07-03 DIAGNOSIS — R262 Difficulty in walking, not elsewhere classified: Secondary | ICD-10-CM | POA: Insufficient documentation

## 2012-07-03 DIAGNOSIS — IMO0001 Reserved for inherently not codable concepts without codable children: Secondary | ICD-10-CM | POA: Insufficient documentation

## 2012-07-03 DIAGNOSIS — M25569 Pain in unspecified knee: Secondary | ICD-10-CM | POA: Insufficient documentation

## 2012-07-05 ENCOUNTER — Ambulatory Visit: Payer: 59 | Admitting: Physical Therapy

## 2012-07-09 ENCOUNTER — Ambulatory Visit: Payer: 59 | Admitting: Physical Therapy

## 2012-07-11 ENCOUNTER — Ambulatory Visit: Payer: 59 | Admitting: Physical Therapy

## 2012-07-13 ENCOUNTER — Ambulatory Visit: Payer: 59 | Admitting: Physical Therapy

## 2012-07-17 ENCOUNTER — Ambulatory Visit: Payer: 59 | Admitting: Physical Therapy

## 2012-07-19 ENCOUNTER — Ambulatory Visit: Payer: 59 | Admitting: Physical Therapy

## 2012-07-24 ENCOUNTER — Ambulatory Visit: Payer: 59 | Admitting: Physical Therapy

## 2012-07-26 ENCOUNTER — Ambulatory Visit: Payer: 59 | Admitting: Physical Therapy

## 2012-07-31 ENCOUNTER — Ambulatory Visit: Payer: 59 | Admitting: Physical Therapy

## 2012-08-02 ENCOUNTER — Ambulatory Visit: Payer: 59 | Admitting: Physical Therapy

## 2012-08-07 ENCOUNTER — Ambulatory Visit: Payer: 59 | Attending: Orthopedic Surgery | Admitting: Physical Therapy

## 2012-08-07 DIAGNOSIS — M25669 Stiffness of unspecified knee, not elsewhere classified: Secondary | ICD-10-CM | POA: Insufficient documentation

## 2012-08-07 DIAGNOSIS — IMO0001 Reserved for inherently not codable concepts without codable children: Secondary | ICD-10-CM | POA: Insufficient documentation

## 2012-08-07 DIAGNOSIS — R262 Difficulty in walking, not elsewhere classified: Secondary | ICD-10-CM | POA: Insufficient documentation

## 2012-08-07 DIAGNOSIS — M25569 Pain in unspecified knee: Secondary | ICD-10-CM | POA: Insufficient documentation

## 2012-08-07 DIAGNOSIS — R609 Edema, unspecified: Secondary | ICD-10-CM | POA: Insufficient documentation

## 2012-08-07 DIAGNOSIS — Z96659 Presence of unspecified artificial knee joint: Secondary | ICD-10-CM | POA: Insufficient documentation

## 2012-08-09 ENCOUNTER — Ambulatory Visit: Payer: 59 | Admitting: Physical Therapy

## 2012-08-31 ENCOUNTER — Ambulatory Visit: Payer: 59 | Admitting: Physical Therapy

## 2012-09-04 ENCOUNTER — Ambulatory Visit: Payer: 59 | Attending: Orthopedic Surgery | Admitting: Physical Therapy

## 2012-09-04 DIAGNOSIS — R609 Edema, unspecified: Secondary | ICD-10-CM | POA: Insufficient documentation

## 2012-09-04 DIAGNOSIS — Z96659 Presence of unspecified artificial knee joint: Secondary | ICD-10-CM | POA: Insufficient documentation

## 2012-09-04 DIAGNOSIS — R262 Difficulty in walking, not elsewhere classified: Secondary | ICD-10-CM | POA: Insufficient documentation

## 2012-09-04 DIAGNOSIS — M25569 Pain in unspecified knee: Secondary | ICD-10-CM | POA: Insufficient documentation

## 2012-09-04 DIAGNOSIS — M25669 Stiffness of unspecified knee, not elsewhere classified: Secondary | ICD-10-CM | POA: Insufficient documentation

## 2012-09-04 DIAGNOSIS — IMO0001 Reserved for inherently not codable concepts without codable children: Secondary | ICD-10-CM | POA: Insufficient documentation

## 2012-09-05 ENCOUNTER — Ambulatory Visit: Payer: 59 | Admitting: Physical Therapy

## 2012-09-06 ENCOUNTER — Ambulatory Visit: Payer: 59 | Admitting: Physical Therapy

## 2012-09-07 ENCOUNTER — Ambulatory Visit: Payer: 59 | Admitting: Physical Therapy

## 2012-09-11 ENCOUNTER — Ambulatory Visit: Payer: 59 | Admitting: Physical Therapy

## 2012-09-12 ENCOUNTER — Ambulatory Visit: Payer: 59 | Admitting: Physical Therapy

## 2012-09-13 ENCOUNTER — Ambulatory Visit: Payer: 59 | Admitting: Physical Therapy

## 2012-09-14 ENCOUNTER — Ambulatory Visit: Payer: 59 | Admitting: Physical Therapy

## 2012-09-17 ENCOUNTER — Ambulatory Visit: Payer: 59 | Admitting: Physical Therapy

## 2012-09-18 ENCOUNTER — Ambulatory Visit: Payer: 59 | Admitting: Physical Therapy

## 2012-09-18 ENCOUNTER — Encounter: Payer: 59 | Admitting: Physical Therapy

## 2012-09-19 ENCOUNTER — Ambulatory Visit: Payer: 59 | Admitting: Physical Therapy

## 2012-09-20 ENCOUNTER — Ambulatory Visit: Payer: 59 | Admitting: Physical Therapy

## 2012-09-21 ENCOUNTER — Ambulatory Visit: Payer: 59 | Admitting: Physical Therapy

## 2012-09-24 ENCOUNTER — Ambulatory Visit: Payer: 59 | Admitting: Physical Therapy

## 2012-09-27 ENCOUNTER — Ambulatory Visit: Payer: 59 | Admitting: Physical Therapy

## 2012-10-01 ENCOUNTER — Ambulatory Visit: Payer: 59 | Admitting: Physical Therapy

## 2012-10-04 ENCOUNTER — Ambulatory Visit: Payer: 59 | Attending: Orthopedic Surgery | Admitting: Physical Therapy

## 2012-10-04 DIAGNOSIS — M25669 Stiffness of unspecified knee, not elsewhere classified: Secondary | ICD-10-CM | POA: Insufficient documentation

## 2012-10-04 DIAGNOSIS — Z96659 Presence of unspecified artificial knee joint: Secondary | ICD-10-CM | POA: Insufficient documentation

## 2012-10-04 DIAGNOSIS — R262 Difficulty in walking, not elsewhere classified: Secondary | ICD-10-CM | POA: Insufficient documentation

## 2012-10-04 DIAGNOSIS — IMO0001 Reserved for inherently not codable concepts without codable children: Secondary | ICD-10-CM | POA: Insufficient documentation

## 2012-10-04 DIAGNOSIS — M25569 Pain in unspecified knee: Secondary | ICD-10-CM | POA: Insufficient documentation

## 2012-10-04 DIAGNOSIS — R609 Edema, unspecified: Secondary | ICD-10-CM | POA: Insufficient documentation

## 2012-10-09 ENCOUNTER — Ambulatory Visit: Payer: 59 | Admitting: Physical Therapy

## 2012-10-15 ENCOUNTER — Ambulatory Visit: Payer: 59 | Admitting: Physical Therapy

## 2013-05-09 ENCOUNTER — Other Ambulatory Visit (HOSPITAL_COMMUNITY): Payer: Self-pay | Admitting: Orthopedic Surgery

## 2013-06-13 ENCOUNTER — Encounter (HOSPITAL_COMMUNITY): Payer: Self-pay | Admitting: Pharmacy Technician

## 2013-06-17 ENCOUNTER — Encounter (HOSPITAL_COMMUNITY): Payer: Self-pay

## 2013-06-17 ENCOUNTER — Encounter (HOSPITAL_COMMUNITY)
Admission: RE | Admit: 2013-06-17 | Discharge: 2013-06-17 | Disposition: A | Discharge status: Home / Self Care | Payer: 59 | Source: Ambulatory Visit | Attending: Orthopedic Surgery | Admitting: Orthopedic Surgery

## 2013-06-17 DIAGNOSIS — Z01818 Encounter for other preprocedural examination: Secondary | ICD-10-CM | POA: Insufficient documentation

## 2013-06-17 DIAGNOSIS — Z01812 Encounter for preprocedural laboratory examination: Secondary | ICD-10-CM | POA: Insufficient documentation

## 2013-06-17 DIAGNOSIS — Z01810 Encounter for preprocedural cardiovascular examination: Secondary | ICD-10-CM | POA: Diagnosis not present

## 2013-06-17 HISTORY — DX: Fibromyalgia: M79.7

## 2013-06-17 LAB — BASIC METABOLIC PANEL
BUN: 21 mg/dL (ref 6–23)
CALCIUM: 9.5 mg/dL (ref 8.4–10.5)
CO2: 24 meq/L (ref 19–32)
Chloride: 99 mEq/L (ref 96–112)
Creatinine, Ser: 1.01 mg/dL (ref 0.50–1.10)
GFR calc Af Amer: 70 mL/min — ABNORMAL LOW (ref >90)
GFR, EST NON AFRICAN AMERICAN: 61 mL/min — AB (ref >90)
Glucose, Bld: 107 mg/dL — ABNORMAL HIGH (ref 70–99)
Potassium: 4.1 mEq/L (ref 3.7–5.3)
SODIUM: 138 meq/L (ref 137–147)

## 2013-06-17 LAB — CBC
HCT: 41.3 % (ref 36.0–46.0)
Hemoglobin: 14.1 g/dL (ref 12.0–15.0)
MCH: 29.1 pg (ref 26.0–34.0)
MCHC: 34.1 g/dL (ref 30.0–36.0)
MCV: 85.2 fL (ref 78.0–100.0)
PLATELETS: 263 10*3/uL (ref 150–400)
RBC: 4.85 MIL/uL (ref 3.87–5.11)
RDW: 13 % (ref 11.5–15.5)
WBC: 6.1 10*3/uL (ref 4.0–10.5)

## 2013-06-17 LAB — URINE MICROSCOPIC-ADD ON

## 2013-06-17 LAB — URINALYSIS, ROUTINE W REFLEX MICROSCOPIC
Bilirubin Urine: NEGATIVE
GLUCOSE, UA: NEGATIVE mg/dL
HGB URINE DIPSTICK: NEGATIVE
KETONES UR: NEGATIVE mg/dL
Nitrite: NEGATIVE
PH: 6 (ref 5.0–8.0)
Protein, ur: NEGATIVE mg/dL
Specific Gravity, Urine: 1.019 (ref 1.005–1.030)
Urobilinogen, UA: 1 mg/dL (ref 0.0–1.0)

## 2013-06-17 LAB — SURGICAL PCR SCREEN
MRSA, PCR: NEGATIVE
Staphylococcus aureus: NEGATIVE

## 2013-06-17 NOTE — Pre-Procedure Instructions (Signed)
Karla Bishop  06/17/2013   Your procedure is scheduled on:  Tuesday, June 23rd   Report to [facility] Admitting at  5:30 AM.   Call this number if you have problems the morning of surgery: [phone]   Remember:   Do not eat food or drink liquids after midnight Monday.   Take these medicines the morning of surgery with A SIP OF WATER: Cymbalta, Hydrocodone   Do not wear jewelry, make-up or nail polish.  Do not wear lotions, powders, or perfumes. You may NOT wear deodorant.  Do not shave underarms & legs 48 hours prior to surgery.    Do not bring valuables to the hospital.  [facility] is not responsible for any belongings or valuables.               Contacts, dentures or bridgework may not be worn into surgery.  Leave suitcase in the car. After surgery it may be brought to your room.  For patients admitted to the hospital, discharge time is determined by your treatment team.              Name and phone number of your driver:    Special Instructions: "Preparing for Surgery" instruction sheet.   Please read over the following fact sheets that you were given: Pain Booklet, Coughing and Deep Breathing, Blood Transfusion Information, MRSA Information and Surgical Site Infection Prevention

## 2013-06-18 LAB — TYPE AND SCREEN
ABO/RH(D): O POS
Antibody Screen: NEGATIVE

## 2013-06-24 MED ORDER — CHLORHEXIDINE GLUCONATE 4 % EX LIQD
60.0000 mL | Freq: Once | CUTANEOUS | Status: DC
  Filled 2013-06-24: qty 60

## 2013-06-24 MED ORDER — CEFAZOLIN SODIUM-DEXTROSE 2-3 GM-% IV SOLR
2.0000 g | INTRAVENOUS | Status: AC
  Administered 2013-06-25: 2 g via INTRAVENOUS
  Filled 2013-06-24: qty 50

## 2013-06-25 ENCOUNTER — Encounter (HOSPITAL_COMMUNITY): Payer: Self-pay

## 2013-06-25 ENCOUNTER — Encounter (HOSPITAL_COMMUNITY): Payer: 59 | Admitting: Anesthesiology

## 2013-06-25 ENCOUNTER — Inpatient Hospital Stay (HOSPITAL_COMMUNITY)
Admission: RE | Admit: 2013-06-25 | Discharge: 2013-06-27 | DRG: 470 | Disposition: A | Discharge status: Home Health | Payer: 59 | Source: Ambulatory Visit | Attending: Orthopedic Surgery | Admitting: Orthopedic Surgery

## 2013-06-25 ENCOUNTER — Inpatient Hospital Stay (HOSPITAL_COMMUNITY): Payer: 59 | Admitting: Anesthesiology

## 2013-06-25 ENCOUNTER — Encounter (HOSPITAL_COMMUNITY)
Admission: RE | Disposition: A | Discharge status: Home Health | Payer: Self-pay | Source: Ambulatory Visit | Attending: Orthopedic Surgery

## 2013-06-25 DIAGNOSIS — M2540 Effusion, unspecified joint: Secondary | ICD-10-CM | POA: Diagnosis present

## 2013-06-25 DIAGNOSIS — G4730 Sleep apnea, unspecified: Secondary | ICD-10-CM | POA: Diagnosis present

## 2013-06-25 DIAGNOSIS — Z96659 Presence of unspecified artificial knee joint: Secondary | ICD-10-CM

## 2013-06-25 DIAGNOSIS — M1710 Unilateral primary osteoarthritis, unspecified knee: Principal | ICD-10-CM | POA: Diagnosis present

## 2013-06-25 DIAGNOSIS — N189 Chronic kidney disease, unspecified: Secondary | ICD-10-CM | POA: Diagnosis present

## 2013-06-25 DIAGNOSIS — IMO0001 Reserved for inherently not codable concepts without codable children: Secondary | ICD-10-CM | POA: Diagnosis present

## 2013-06-25 HISTORY — PX: TOTAL KNEE ARTHROPLASTY: SHX125

## 2013-06-25 LAB — PROTIME-INR
INR: 1.02 (ref 0.00–1.49)
Prothrombin Time: 13.2 seconds (ref 11.6–15.2)

## 2013-06-25 SURGERY — ARTHROPLASTY, KNEE, TOTAL
Anesthesia: General | Site: Knee | Laterality: Left

## 2013-06-25 MED ORDER — METOCLOPRAMIDE HCL 5 MG PO TABS
5.0000 mg | ORAL_TABLET | Freq: Three times a day (TID) | ORAL | Status: DC | PRN
  Filled 2013-06-25: qty 2

## 2013-06-25 MED ORDER — FENTANYL CITRATE 0.05 MG/ML IJ SOLN
INTRAMUSCULAR | Status: DC | PRN
  Administered 2013-06-25: 25 ug via INTRAVENOUS
  Administered 2013-06-25: 100 ug via INTRAVENOUS
  Administered 2013-06-25 (×2): 25 ug via INTRAVENOUS
  Administered 2013-06-25: 50 ug via INTRAVENOUS
  Administered 2013-06-25 (×3): 25 ug via INTRAVENOUS

## 2013-06-25 MED ORDER — FENTANYL CITRATE 0.05 MG/ML IJ SOLN
INTRAMUSCULAR | Status: AC
  Filled 2013-06-25: qty 5

## 2013-06-25 MED ORDER — ONDANSETRON HCL 4 MG/2ML IJ SOLN
INTRAMUSCULAR | Status: AC
  Filled 2013-06-25: qty 2

## 2013-06-25 MED ORDER — OXYCODONE HCL 5 MG PO TABS
5.0000 mg | ORAL_TABLET | Freq: Once | ORAL | Status: AC | PRN
  Administered 2013-06-25: 5 mg via ORAL

## 2013-06-25 MED ORDER — CLONIDINE HCL (ANALGESIA) 100 MCG/ML EP SOLN
150.0000 ug | EPIDURAL | Status: DC
  Filled 2013-06-25: qty 1.5

## 2013-06-25 MED ORDER — CLONIDINE HCL (ANALGESIA) 100 MCG/ML EP SOLN
EPIDURAL | Status: DC | PRN
  Administered 2013-06-25: 100 ug

## 2013-06-25 MED ORDER — WARFARIN VIDEO: Freq: Once | Status: DC

## 2013-06-25 MED ORDER — WARFARIN SODIUM 5 MG PO TABS
5.0000 mg | ORAL_TABLET | Freq: Once | ORAL | Status: AC
  Administered 2013-06-25: 5 mg via ORAL
  Filled 2013-06-25: qty 1

## 2013-06-25 MED ORDER — METHOCARBAMOL 500 MG PO TABS
ORAL_TABLET | ORAL | Status: AC
  Filled 2013-06-25: qty 1

## 2013-06-25 MED ORDER — PROPOFOL 10 MG/ML IV BOLUS
INTRAVENOUS | Status: DC | PRN
  Administered 2013-06-25: 170 mg via INTRAVENOUS
  Administered 2013-06-25: 30 mg via INTRAVENOUS

## 2013-06-25 MED ORDER — MIDAZOLAM HCL 5 MG/5ML IJ SOLN
INTRAMUSCULAR | Status: DC | PRN
  Administered 2013-06-25: 0.5 mg via INTRAVENOUS
  Administered 2013-06-25: 1 mg via INTRAVENOUS
  Administered 2013-06-25: 0.5 mg via INTRAVENOUS

## 2013-06-25 MED ORDER — METOCLOPRAMIDE HCL 5 MG/ML IJ SOLN
5.0000 mg | Freq: Three times a day (TID) | INTRAMUSCULAR | Status: DC | PRN
  Administered 2013-06-25: 10 mg via INTRAVENOUS
  Filled 2013-06-25: qty 2

## 2013-06-25 MED ORDER — BUPIVACAINE HCL (PF) 0.25 % IJ SOLN
INTRAMUSCULAR | Status: AC
  Filled 2013-06-25: qty 30

## 2013-06-25 MED ORDER — ONDANSETRON HCL 4 MG PO TABS: 4.0000 mg | ORAL_TABLET | Freq: Four times a day (QID) | ORAL | Status: DC | PRN

## 2013-06-25 MED ORDER — ACETAMINOPHEN 650 MG RE SUPP: 650.0000 mg | Freq: Four times a day (QID) | RECTAL | Status: DC | PRN

## 2013-06-25 MED ORDER — MORPHINE SULFATE 2 MG/ML IJ SOLN
INTRAMUSCULAR | Status: AC
  Filled 2013-06-25: qty 2

## 2013-06-25 MED ORDER — SODIUM CHLORIDE 0.9 % IR SOLN
Status: DC | PRN
  Administered 2013-06-25 (×2): 3000 mL

## 2013-06-25 MED ORDER — MORPHINE SULFATE 4 MG/ML IJ SOLN
INTRAMUSCULAR | Status: DC | PRN
  Administered 2013-06-25: 4 mg via SUBCUTANEOUS

## 2013-06-25 MED ORDER — DULOXETINE HCL 30 MG PO CPEP
30.0000 mg | ORAL_CAPSULE | Freq: Every day | ORAL | Status: DC
  Administered 2013-06-26 – 2013-06-27 (×2): 30 mg via ORAL
  Filled 2013-06-25 (×3): qty 1

## 2013-06-25 MED ORDER — METHOCARBAMOL 1000 MG/10ML IJ SOLN
500.0000 mg | Freq: Four times a day (QID) | INTRAVENOUS | Status: DC | PRN
  Filled 2013-06-25: qty 5

## 2013-06-25 MED ORDER — MIDAZOLAM HCL 2 MG/2ML IJ SOLN
INTRAMUSCULAR | Status: AC
  Filled 2013-06-25: qty 2

## 2013-06-25 MED ORDER — PROPOFOL 10 MG/ML IV BOLUS
INTRAVENOUS | Status: AC
  Filled 2013-06-25: qty 20

## 2013-06-25 MED ORDER — ACETAMINOPHEN 325 MG PO TABS: 650.0000 mg | ORAL_TABLET | Freq: Four times a day (QID) | ORAL | Status: DC | PRN

## 2013-06-25 MED ORDER — DOCUSATE SODIUM 100 MG PO CAPS
100.0000 mg | ORAL_CAPSULE | Freq: Two times a day (BID) | ORAL | Status: DC
  Administered 2013-06-25 – 2013-06-27 (×4): 100 mg via ORAL
  Filled 2013-06-25 (×4): qty 1

## 2013-06-25 MED ORDER — MAGNESIUM OXIDE 400 (241.3 MG) MG PO TABS
200.0000 mg | ORAL_TABLET | Freq: Every day | ORAL | Status: DC
  Filled 2013-06-25: qty 0.5

## 2013-06-25 MED ORDER — WARFARIN - PHARMACIST DOSING INPATIENT: Freq: Every day | Status: DC

## 2013-06-25 MED ORDER — BUPIVACAINE HCL (PF) 0.25 % IJ SOLN
INTRAMUSCULAR | Status: DC | PRN
  Administered 2013-06-25 (×2): 30 mL

## 2013-06-25 MED ORDER — TRANEXAMIC ACID 100 MG/ML IV SOLN
1000.0000 mg | INTRAVENOUS | Status: AC
  Administered 2013-06-25: 1000 mg via INTRAVENOUS
  Filled 2013-06-25: qty 10

## 2013-06-25 MED ORDER — HYDROMORPHONE HCL PF 1 MG/ML IJ SOLN
1.0000 mg | INTRAMUSCULAR | Status: DC | PRN
  Administered 2013-06-25 – 2013-06-26 (×3): 1 mg via INTRAVENOUS
  Filled 2013-06-25 (×3): qty 1

## 2013-06-25 MED ORDER — HYDROCODONE-ACETAMINOPHEN 10-325 MG PO TABS
1.0000 | ORAL_TABLET | ORAL | Status: DC | PRN
  Administered 2013-06-25: 1 via ORAL
  Administered 2013-06-26 – 2013-06-27 (×7): 2 via ORAL
  Filled 2013-06-25 (×6): qty 2
  Filled 2013-06-25: qty 1
  Filled 2013-06-25: qty 2

## 2013-06-25 MED ORDER — BUPIVACAINE LIPOSOME 1.3 % IJ SUSP
20.0000 mL | INTRAMUSCULAR | Status: DC
  Filled 2013-06-25: qty 20

## 2013-06-25 MED ORDER — ONDANSETRON HCL 4 MG/2ML IJ SOLN
INTRAMUSCULAR | Status: DC | PRN
  Administered 2013-06-25: 4 mg via INTRAVENOUS

## 2013-06-25 MED ORDER — METHOCARBAMOL 500 MG PO TABS
500.0000 mg | ORAL_TABLET | Freq: Four times a day (QID) | ORAL | Status: DC | PRN
  Administered 2013-06-25 – 2013-06-26 (×4): 500 mg via ORAL
  Filled 2013-06-25 (×3): qty 1

## 2013-06-25 MED ORDER — ONDANSETRON HCL 4 MG/2ML IJ SOLN
4.0000 mg | Freq: Four times a day (QID) | INTRAMUSCULAR | Status: DC | PRN
  Administered 2013-06-26: 4 mg via INTRAVENOUS
  Filled 2013-06-25: qty 2

## 2013-06-25 MED ORDER — MAGNESIUM 500 MG PO TABS: 500.0000 | ORAL_TABLET | Freq: Every day | ORAL | Status: DC

## 2013-06-25 MED ORDER — MENTHOL 3 MG MT LOZG: 1.0000 | LOZENGE | OROMUCOSAL | Status: DC | PRN

## 2013-06-25 MED ORDER — BUPIVACAINE LIPOSOME 1.3 % IJ SUSP
INTRAMUSCULAR | Status: DC | PRN
  Administered 2013-06-25: 09:00:00

## 2013-06-25 MED ORDER — ONDANSETRON HCL 4 MG/2ML IJ SOLN: 4.0000 mg | Freq: Once | INTRAMUSCULAR | Status: DC | PRN

## 2013-06-25 MED ORDER — POTASSIUM CHLORIDE IN NACL 20-0.9 MEQ/L-% IV SOLN
INTRAVENOUS | Status: AC
  Administered 2013-06-25: 75 mL/h via INTRAVENOUS
  Filled 2013-06-25 (×5): qty 1000

## 2013-06-25 MED ORDER — LIDOCAINE HCL (CARDIAC) 20 MG/ML IV SOLN
INTRAVENOUS | Status: AC
Start: 2013-06-25 — End: 2013-06-25
  Filled 2013-06-25: qty 5

## 2013-06-25 MED ORDER — 0.9 % SODIUM CHLORIDE (POUR BTL) OPTIME
TOPICAL | Status: DC | PRN
  Administered 2013-06-25: 1000 mL

## 2013-06-25 MED ORDER — MAGNESIUM OXIDE 400 (241.3 MG) MG PO TABS
400.0000 mg | ORAL_TABLET | Freq: Every day | ORAL | Status: DC
  Administered 2013-06-26 – 2013-06-27 (×2): 400 mg via ORAL
  Filled 2013-06-25 (×3): qty 1

## 2013-06-25 MED ORDER — HYDROMORPHONE HCL PF 1 MG/ML IJ SOLN
INTRAMUSCULAR | Status: AC
  Filled 2013-06-25: qty 1

## 2013-06-25 MED ORDER — CEFAZOLIN SODIUM-DEXTROSE 2-3 GM-% IV SOLR
2.0000 g | Freq: Three times a day (TID) | INTRAVENOUS | Status: AC
  Administered 2013-06-25 (×2): 2 g via INTRAVENOUS
  Filled 2013-06-25 (×2): qty 50

## 2013-06-25 MED ORDER — DEXTROSE 5 % IV SOLN
INTRAVENOUS | Status: DC | PRN
  Administered 2013-06-25: 08:00:00 via INTRAVENOUS

## 2013-06-25 MED ORDER — BUPIVACAINE-EPINEPHRINE (PF) 0.5% -1:200000 IJ SOLN
INTRAMUSCULAR | Status: AC
  Filled 2013-06-25: qty 30

## 2013-06-25 MED ORDER — OXYCODONE HCL 5 MG PO TABS
ORAL_TABLET | ORAL | Status: AC
  Filled 2013-06-25: qty 1

## 2013-06-25 MED ORDER — LACTATED RINGERS IV SOLN
INTRAVENOUS | Status: DC | PRN
  Administered 2013-06-25 (×2): via INTRAVENOUS

## 2013-06-25 MED ORDER — BUPIVACAINE HCL (PF) 0.25 % IJ SOLN
INTRAMUSCULAR | Status: AC
  Filled 2013-06-25: qty 10

## 2013-06-25 MED ORDER — COUMADIN BOOK
Freq: Once | Status: AC
  Administered 2013-06-25: 18:00:00
  Filled 2013-06-25: qty 1

## 2013-06-25 MED ORDER — PHENOL 1.4 % MT LIQD: 1.0000 | OROMUCOSAL | Status: DC | PRN

## 2013-06-25 MED ORDER — OXYCODONE HCL 5 MG/5ML PO SOLN: 5.0000 mg | Freq: Once | ORAL | Status: AC | PRN

## 2013-06-25 MED ORDER — LIDOCAINE HCL (CARDIAC) 20 MG/ML IV SOLN
INTRAVENOUS | Status: DC | PRN
  Administered 2013-06-25: 100 mg via INTRAVENOUS

## 2013-06-25 MED ORDER — HYDROMORPHONE HCL PF 1 MG/ML IJ SOLN
0.2500 mg | INTRAMUSCULAR | Status: DC | PRN
  Administered 2013-06-25 (×2): 0.5 mg via INTRAVENOUS

## 2013-06-25 MED ORDER — MEPERIDINE HCL 25 MG/ML IJ SOLN
6.2500 mg | INTRAMUSCULAR | Status: DC | PRN
Start: 2013-06-25 — End: 2013-06-25

## 2013-06-25 SURGICAL SUPPLY — 75 items
BANDAGE ELASTIC 4 VELCRO ST LF (GAUZE/BANDAGES/DRESSINGS) ×3 IMPLANT
BANDAGE ESMARK 6X9 LF (GAUZE/BANDAGES/DRESSINGS) ×1 IMPLANT
BLADE SAG 18X100X1.27 (BLADE) ×3 IMPLANT
BLADE SAW SGTL 13.0X1.19X90.0M (BLADE) ×3 IMPLANT
BNDG CMPR 9X6 STRL LF SNTH (GAUZE/BANDAGES/DRESSINGS) ×1
BNDG CMPR MED 10X6 ELC LF (GAUZE/BANDAGES/DRESSINGS) ×1
BNDG ELASTIC 6X10 VLCR STRL LF (GAUZE/BANDAGES/DRESSINGS) ×3 IMPLANT
BNDG ESMARK 6X9 LF (GAUZE/BANDAGES/DRESSINGS) ×3
BOWL SMART MIX CTS (DISPOSABLE) ×3 IMPLANT
CEMENT BONE SIMPLEX SPEEDSET (Cement) ×6 IMPLANT
COVER SURGICAL LIGHT HANDLE (MISCELLANEOUS) ×3 IMPLANT
CUFF TOURNIQUET SINGLE 24IN (TOURNIQUET CUFF) ×3 IMPLANT
DRAPE INCISE IOBAN 66X45 STRL (DRAPES) ×3 IMPLANT
DRAPE ORTHO SPLIT 77X108 STRL (DRAPES) ×9
DRAPE SURG ORHT 6 SPLT 77X108 (DRAPES) ×3 IMPLANT
DRAPE U-SHAPE 47X51 STRL (DRAPES) ×3 IMPLANT
DRSG PAD ABDOMINAL 8X10 ST (GAUZE/BANDAGES/DRESSINGS) ×3 IMPLANT
DURAPREP 26ML APPLICATOR (WOUND CARE) ×3 IMPLANT
ELECT REM PT RETURN 9FT ADLT (ELECTROSURGICAL) ×3
ELECTRODE REM PT RTRN 9FT ADLT (ELECTROSURGICAL) ×1 IMPLANT
FACESHIELD WRAPAROUND (MASK) ×3 IMPLANT
GAUZE XEROFORM 5X9 LF (GAUZE/BANDAGES/DRESSINGS) ×3 IMPLANT
GLOVE BIO SURGEON STRL SZ7.5 (GLOVE) ×3 IMPLANT
GLOVE BIOGEL PI IND STRL 7.0 (GLOVE) ×2 IMPLANT
GLOVE BIOGEL PI IND STRL 7.5 (GLOVE) ×1 IMPLANT
GLOVE BIOGEL PI IND STRL 8 (GLOVE) ×1 IMPLANT
GLOVE BIOGEL PI INDICATOR 7.0 (GLOVE) ×4
GLOVE BIOGEL PI INDICATOR 7.5 (GLOVE) ×2
GLOVE BIOGEL PI INDICATOR 8 (GLOVE) ×2
GLOVE ECLIPSE 7.0 STRL STRAW (GLOVE) ×6 IMPLANT
GLOVE SURG ORTHO 8.0 STRL STRW (GLOVE) ×3 IMPLANT
GLOVE SURG SS PI 7.0 STRL IVOR (GLOVE) ×3 IMPLANT
GOWN STRL REUS W/ TWL LRG LVL3 (GOWN DISPOSABLE) ×4 IMPLANT
GOWN STRL REUS W/ TWL XL LVL3 (GOWN DISPOSABLE) ×1 IMPLANT
GOWN STRL REUS W/TWL LRG LVL3 (GOWN DISPOSABLE) ×12
GOWN STRL REUS W/TWL XL LVL3 (GOWN DISPOSABLE) ×3
HANDPIECE INTERPULSE COAX TIP (DISPOSABLE) ×3
HOOD PEEL AWAY FACE SHEILD DIS (HOOD) ×15 IMPLANT
IMMOBILIZER KNEE 22 (SOFTGOODS) ×3 IMPLANT
IMMOBILIZER KNEE 22 UNIV (SOFTGOODS) ×3 IMPLANT
KIT BASIN OR (CUSTOM PROCEDURE TRAY) ×3 IMPLANT
KIT ROOM TURNOVER OR (KITS) ×3 IMPLANT
KNEE/VIT E POLY LINER LEVEL 1B ×3 IMPLANT
MANIFOLD NEPTUNE II (INSTRUMENTS) ×3 IMPLANT
NEEDLE 18GX1X1/2 (RX/OR ONLY) (NEEDLE) ×3 IMPLANT
NEEDLE 21X1 OR PACK (NEEDLE) ×6 IMPLANT
NEEDLE HYPO 22GX1.5 SAFETY (NEEDLE) ×6 IMPLANT
NEEDLE SPNL 18GX3.5 QUINCKE PK (NEEDLE) ×3 IMPLANT
NS IRRIG 1000ML POUR BTL (IV SOLUTION) ×6 IMPLANT
PACK TOTAL JOINT (CUSTOM PROCEDURE TRAY) ×3 IMPLANT
PAD ARMBOARD 7.5X6 YLW CONV (MISCELLANEOUS) ×6 IMPLANT
PADDING CAST ABS 6INX4YD NS (CAST SUPPLIES) ×2
PADDING CAST ABS COTTON 6X4 NS (CAST SUPPLIES) ×1 IMPLANT
PADDING CAST COTTON 6X4 STRL (CAST SUPPLIES) ×6 IMPLANT
PEN SKIN MARKING BROAD (MISCELLANEOUS) ×3 IMPLANT
RUBBERBAND STERILE (MISCELLANEOUS) IMPLANT
SET HNDPC FAN SPRY TIP SCT (DISPOSABLE) ×1 IMPLANT
SPONGE GAUZE 4X4 12PLY (GAUZE/BANDAGES/DRESSINGS) ×3 IMPLANT
SPONGE LAP 18X18 X RAY DECT (DISPOSABLE) IMPLANT
STAPLER VISISTAT 35W (STAPLE) ×3 IMPLANT
SUCTION FRAZIER TIP 10 FR DISP (SUCTIONS) ×3 IMPLANT
SUT VIC AB 0 CTB1 27 (SUTURE) ×9 IMPLANT
SUT VIC AB 1 CT1 27 (SUTURE) ×12
SUT VIC AB 1 CT1 27XBRD ANBCTR (SUTURE) ×6 IMPLANT
SUT VIC AB 2-0 CT1 27 (SUTURE) ×6
SUT VIC AB 2-0 CT1 TAPERPNT 27 (SUTURE) ×2 IMPLANT
SYR 20CC LL (SYRINGE) ×3 IMPLANT
SYR 30ML LL (SYRINGE) ×3 IMPLANT
SYR 30ML SLIP (SYRINGE) ×3 IMPLANT
SYR 50ML LL SCALE MARK (SYRINGE) ×2 IMPLANT
SYR CONTROL 10ML LL (SYRINGE) ×9 IMPLANT
SYR TB 1ML LUER SLIP (SYRINGE) ×3 IMPLANT
TOWEL OR 17X24 6PK STRL BLUE (TOWEL DISPOSABLE) ×3 IMPLANT
TOWEL OR 17X26 10 PK STRL BLUE (TOWEL DISPOSABLE) ×6 IMPLANT
TRAY FOLEY CATH 16FRSI W/METER (SET/KITS/TRAYS/PACK) ×3 IMPLANT

## 2013-06-25 NOTE — Anesthesia Postprocedure Evaluation (Signed)
Anesthesia Post Note  Patient: Karla Bishop  Procedure(s) Performed: Procedure(s) (LRB): TOTAL KNEE ARTHROPLASTY (Left)  Anesthesia type: general  Patient location: PACU  Post pain: Pain level controlled  Post assessment: Patient's Cardiovascular Status Stable  Last Vitals:  Filed Vitals:   06/25/13 1215  BP: 103/55  Pulse: 80  Temp: 36.3 C  Resp: 13    Post vital signs: Reviewed and stable  Level of consciousness: sedated  Complications: No apparent anesthesia complications

## 2013-06-25 NOTE — Plan of Care (Signed)
Problem: Consults Goal: Diagnosis- Total Joint Replacement Primary Total Knee Left     

## 2013-06-25 NOTE — Progress Notes (Signed)
Orthopedic Tech Progress Note Patient Details:  Karla Bishop 08/04/1955 [id number]  CPM Left Knee CPM Left Knee: On Left Knee Flexion (Degrees): 45 Left Knee Extension (Degrees): 0 Additional Comments: Trapeze bar and foot roll   Irish Elders 06/25/2013, 11:47 AM

## 2013-06-25 NOTE — Anesthesia Preprocedure Evaluation (Addendum)
Anesthesia Evaluation  Patient identified by MRN, date of birth, ID band Patient awake    Reviewed: Allergy & Precautions, H&P , NPO status , Patient's Chart, lab work & pertinent test results, reviewed documented beta blocker date and time   History of Anesthesia Complications (+) PONV  Airway Mallampati: I TM Distance: >3 FB Neck ROM: Full    Dental  (+) Teeth Intact, Dental Advisory Given, Caps, Missing   Pulmonary sleep apnea ,          Cardiovascular     Neuro/Psych    GI/Hepatic   Endo/Other    Renal/GU      Musculoskeletal   Abdominal   Peds  Hematology   Anesthesia Other Findings Crowns upper  Reproductive/Obstetrics                          Anesthesia Physical Anesthesia Plan  ASA: II  Anesthesia Plan: General   Post-op Pain Management:    Induction: Intravenous  Airway Management Planned: LMA  Additional Equipment:   Intra-op Plan:   Post-operative Plan: Extubation in OR  Informed Consent: I have reviewed the patients History and Physical, chart, labs and discussed the procedure including the risks, benefits and alternatives for the proposed anesthesia with the patient or authorized representative who has indicated his/her understanding and acceptance.     Plan Discussed with: CRNA and Surgeon  Anesthesia Plan Comments:         Anesthesia Quick Evaluation

## 2013-06-25 NOTE — Evaluation (Signed)
Physical Therapy Evaluation Patient Details Name: Karla Bishop MRN: [id number] DOB: [birth year] Today's Date: 06/25/2013   History of Present Illness  pt presents post L TKA with R TKA ~26yr ago.    Clinical Impression  Pt requiring only MinA for OOB, however mobility is limited by nausea and feeling flush.  Pt has all needed DME, but would benefit from continued therapy to maximize independence.  Will continue to follow.      Follow Up Recommendations Home health PT;Supervision for mobility/OOB    Equipment Recommendations  None recommended by PT    Recommendations for Other Services       Precautions / Restrictions Precautions Precautions: Fall Required Braces or Orthoses: Knee Immobilizer - Left Knee Immobilizer - Left: On when out of bed or walking;Discontinue once straight leg raise with < 10 degree lag Restrictions Weight Bearing Restrictions: Yes LLE Weight Bearing: Weight bearing as tolerated      Mobility  Bed Mobility Overal bed mobility: Needs Assistance Bed Mobility: Supine to Sit;Sit to Supine     Supine to sit: Min assist;HOB elevated Sit to supine: Min assist   General bed mobility comments: A with L LE only.  pt felt flush upon initial sitting and needed to return to supine for a few minutes.    Transfers Overall transfer level: Needs assistance Equipment used: Rolling walker (2 wheeled) Transfers: Sit to/from [facility] Sit to Stand: Min assist Stand pivot transfers: Min assist       General transfer comment: cues for UE use and step-by-step through pivot.    Ambulation/Gait                Stairs            Wheelchair Mobility    Modified Rankin (Stroke Patients Only)       Balance Overall balance assessment: Needs assistance Sitting-balance support: Single extremity supported;Feet supported Sitting balance-Leahy Scale: Poor     Standing balance support: Bilateral upper extremity supported Standing  balance-Leahy Scale: Poor                               Pertinent Vitals/Pain 4-5/10.  L knee premedicated.      Home Living Family/patient expects to be discharged to:: Private residence Living Arrangements: Spouse/significant other;Children Available Help at Discharge: Family;Available 24 hours/day Type of Home: House Home Access: Stairs to enter Entrance Stairs-Rails: None Entrance Stairs-Number of Steps: 2 Home Layout: One level Home Equipment: Walker - 2 wheels;Bedside commode;Shower seat      Prior Function Level of Independence: Independent               Hand Dominance        Extremity/Trunk Assessment   Upper Extremity Assessment: Defer to OT evaluation           Lower Extremity Assessment: LLE deficits/detail   LLE Deficits / Details: Post-op weakness and pain.  ROM not tested 2/2 pain and nausea.       Communication   Communication: No difficulties  Cognition Arousal/Alertness: Awake/alert Behavior During Therapy: WFL for tasks assessed/performed Overall Cognitive Status: Within Functional Limits for tasks assessed                      General Comments      Exercises        Assessment/Plan    PT Assessment Patient needs continued PT services  PT Diagnosis Abnormality  of gait;Acute pain   PT Problem List Decreased strength;Decreased range of motion;Decreased activity tolerance;Decreased balance;Decreased mobility;Decreased knowledge of use of DME;Pain  PT Treatment Interventions DME instruction;Gait training;Stair training;Functional mobility training;Therapeutic activities;Therapeutic exercise;Balance training;Patient/family education   PT Goals (Current goals can be found in the Care Plan section) Acute Rehab PT Goals Patient Stated Goal: Walk normal PT Goal Formulation: With patient Time For Goal Achievement: 07/02/13 Potential to Achieve Goals: Good    Frequency 7X/week   Barriers to discharge         Co-evaluation               End of Session Equipment Utilized During Treatment: Right knee immobilizer Activity Tolerance:  (Limited by nausea and feeling flush) Patient left: in chair;with call bell/phone within reach;with family/visitor present Nurse Communication: Mobility status;Patient requests pain meds (Need for nausea meds)         Time: 9323-5573 PT Time Calculation (min): 37 min   Charges:   PT Evaluation $Initial PT Evaluation Tier I: 1 Procedure PT Treatments $Therapeutic Activity: 23-37 mins   PT G CodesCatarina Hartshorn, [place] 06/25/2013, 2:24 PM

## 2013-06-25 NOTE — Anesthesia Procedure Notes (Signed)
Procedure Name: LMA Insertion Date/Time: 06/25/2013 7:40 AM Performed by: Terrill Mohr Pre-anesthesia Checklist: Patient identified, Emergency Drugs available, Suction available and Patient being monitored Patient Re-evaluated:Patient Re-evaluated prior to inductionOxygen Delivery Method: Circle system utilized Preoxygenation: Pre-oxygenation with 100% oxygen Intubation Type: IV induction Ventilation: Mask ventilation without difficulty LMA: LMA inserted LMA Size: 4.0 Placement Confirmation: breath sounds checked- equal and bilateral and positive ETCO2 Tube secured with: Tape (taped across cheeks; gauze roll b/t teeth) Dental Injury: Teeth and Oropharynx as per pre-operative assessment

## 2013-06-25 NOTE — H&P (Signed)
TOTAL KNEE ADMISSION H&P  Patient is being admitted for left total knee arthroplasty.  Subjective:  Chief Complaint:left knee pain.  HPI: Karla Bishop, 58 y.o. female, has a history of pain and functional disability in the left knee due to arthritis and has failed non-surgical conservative treatments for greater than 12 weeks to includeNSAID's and/or analgesics, corticosteriod injections, viscosupplementation injections, flexibility and strengthening excercises and activity modification.  Onset of symptoms was gradual, starting 10 years ago with gradually worsening course since that time. The patient noted prior procedures on the knee to include  menisectomy on the left knee(s).  Patient currently rates pain in the left knee(s) at 8 out of 10 with activity. Patient has night pain, worsening of pain with activity and weight bearing, pain that interferes with activities of daily living, pain with passive range of motion, crepitus and joint swelling.  Patient has evidence of joint space narrowing by imaging studies. This patient has had good result with right TKA. There is no active infection.  There are no active problems to display for this patient.  Past Medical History  Diagnosis Date  . Sleep apnea     no cpap  . Cancer     renal cell  . Arthritis   . PONV (postoperative nausea and vomiting)     last couple of surgeries, she has been spared  . Chronic kidney disease     left kidney removed for renal cell carcimona  . Fibromyalgia     Past Surgical History  Procedure Laterality Date  . Breast surgery Bilateral     brest reduction  . Abdominal hysterectomy    . Knee arthroscopic Bilateral   . Kidney removed Left   . Removal ovaries Bilateral     at 2 diffierent times due to cysts  . Total knee arthroplasty Right 05/22/2012    Procedure: TOTAL KNEE ARTHROPLASTY- right;  Surgeon: Meredith Pel, MD;  Location: [facility];  Service: Orthopedics;  Laterality: Right;  Right Total Knee  Arthroplasty     Prescriptions prior to admission  Medication Sig Dispense Refill  . acetaminophen (TYLENOL) 500 MG tablet Take 500 mg by mouth every 6 (six) hours as needed.      . DULoxetine (CYMBALTA) 30 MG capsule Take 30 mg by mouth daily.      Marland Kitchen HYDROcodone-acetaminophen (NORCO) 10-325 MG per tablet Take 1 tablet by mouth every 4 (four) hours as needed (breakthrough pain).  60 tablet  0  . Magnesium 500 MG TABS Take 500 tablets by mouth daily.      Marland Kitchen OVER THE COUNTER MEDICATION Take 1,000 mg by mouth daily. Cherry extract       Allergies  Allergen Reactions  . Percocet [Oxycodone-Acetaminophen] Other (See Comments)    Per patient "it does not agree with me, I do not like the way it makes me feel"    History  Substance Use Topics  . Smoking status: Never Smoker   . Smokeless tobacco: Not on file  . Alcohol Use: Yes     Comment: occasionally    No family history on file.   Review of Systems  Constitutional: Negative.   HENT: Negative.   Eyes: Negative.   Respiratory: Negative.   Cardiovascular: Negative.   Gastrointestinal: Negative.   Genitourinary: Negative.   Musculoskeletal: Positive for joint pain.  Skin: Negative.   Neurological: Negative.   Endo/Heme/Allergies: Negative.   Psychiatric/Behavioral: Negative.     Objective:  Physical Exam  Constitutional: She appears well-developed.  HENT:  Head: Normocephalic.  Eyes: Pupils are equal, round, and reactive to light.  Neck: Normal range of motion.  Cardiovascular: Normal rate.   Respiratory: Effort normal.  Neurological: She is alert.  Skin: Skin is warm.  Psychiatric: She has a normal mood and affect.  left knee rom 5 - 115 - dp 2/4 - collaterals stable - pf crepitus and joint line pain present  Vital signs in last 24 hours: Temp:  [98.1 F (36.7 C)] 98.1 F (36.7 C) (06/23 0544) Pulse Rate:  [74] 74 (06/23 0544) Resp:  [20] 20 (06/23 0544) BP: (116)/(67) 116/67 mmHg (06/23 0544) SpO2:  [97 %] 97 %  (06/23 0544) Weight:  [90.266 kg (199 lb)] 90.266 kg (199 lb) (06/23 0544)  Labs:   Estimated body mass index is 35.26 kg/(m^2) as calculated from the following:   Height as of this encounter: 5\' 3"  (1.6 m).   Weight as of this encounter: 90.266 kg (199 lb).   Imaging Review Plain radiographs demonstrate moderate degenerative joint disease of the left knee(s). The overall alignment ismild varus. The bone quality appears to be good for age and reported activity level.  Assessment/Plan:  End stage arthritis, left knee   The patient history, physical examination, clinical judgment of the provider and imaging studies are consistent with end stage degenerative joint disease of the left knee(s) and total knee arthroplasty is deemed medically necessary. The treatment options including medical management, injection therapy arthroscopy and arthroplasty were discussed at length. The risks and benefits of total knee arthroplasty were presented and reviewed. The risks due to aseptic loosening, infection, stiffness, patella tracking problems, thromboembolic complications and other imponderables were discussed. The patient acknowledged the explanation, agreed to proceed with the plan and consent was signed. Patient is being admitted for inpatient treatment for surgery, pain control, PT, OT, prophylactic antibiotics, VTE prophylaxis, progressive ambulation and ADL's and discharge planning. The patient is planning to be discharged home with home health services

## 2013-06-25 NOTE — Progress Notes (Signed)
Utilization review completed.  

## 2013-06-25 NOTE — Op Note (Signed)
[name], [name]:  [id number]  MEDICAL RECORD NO.:  [id number]  LOCATION:  5N10C                        FACILITY:  [facility]  PHYSICIAN:  Anderson Malta, M.D.    DATE OF BIRTH:  14-Sep-1955  DATE OF PROCEDURE: DATE OF DISCHARGE:                              OPERATIVE REPORT   PREOPERATIVE DIAGNOSIS:  Left knee arthritis.  POSTOPERATIVE DIAGNOSIS:  Left knee arthritis.  PROCEDURE:  Left total knee replacement using Stryker cemented 3 femur, 4 tibia, 11 mm deep dish cruciate sparing poly spacer, 29 mm offset, 3 PEG cemented patella.  SURGEON:  Anderson Malta, M.D.  ASSISTANT:  Epimenio Foot, P.A.  ANESTHESIA:  General.  INDICATIONS:  Karla Bishop is a 58 year old patient with left knee pain, who presents for operative management after explanation of risks and benefits.  She had good results with a right total knee replacement.  PROCEDURE IN DETAIL:  The patient was brought to operating room where general anesthesia was induced. Preoperative antibiotics were administered.  Time-out was called.  Left leg was prescrubbed with alcohol, Betadine, allowed to air dry.  Prepped with DuraPrep solution and draped in a sterile manner.  Charlie Pitter was used to cover the operative field.  Leg was elevated and exsanguinated with Esmarch wrap to 300 mmHg.  Total tourniquet time 86 minutes.  Anterior approach to the knee was made.  Skin and subcu tissue were sharply divided.  Precise median parapatellar arthrotomy was made and marked with #1 Vicryl suture.  Lateral patellofemoral ligament was released. Fat pad partially excised.  Minimal soft tissue dissection performed medially.  At this time, the soft tissue elevated supraperiosteal fashion off the anterior distal femur.  The rest of the suprapatellar pouch remains intact.  Patella was everted.  ACL released using intramedullary alignment, 9 mm was cut off the least affected lateral tibial plateau.  The posterior  neurovascular structures and collateral ligaments were protected.  The distal femur was then cut to 8 mm using flex rod.  The femur measured to size 3.  Anterior, posterior, and chamfer cuts were then performed with the collaterals well protected. The trial component was then placed at the 3 femur and the tibia, which was then keel punched, and was then also placed.  Correct alignment was verified.  The patella was then cut from 20 down to 12 and then a 9 mm 3 PEG patella was placed.  With trial components in position including an 11 mm spacer, the patient achieved full extension, full flexion, excellent patellar tracking using no thumbs technique.  The patient had good stability to varus-valgus stress at 0:30 at 90 degrees.  Trial components were removed.  Thorough irrigation was then performed with 3 L of irrigating solution.  Exparel was then injected into the capsule. The true components then cemented into position.  Excess cement was removed.  The same stability parameters were maintained.  Tourniquet was released.  Bleeding point were controlled with electrocautery.  Skin edges were anesthetized using plain Marcaine and these were closed over bolster using #1 Vicryl suture, 0 Vicryl suture, 2-0 Vicryl suture, and skin staples.  Solution of Marcaine, morphine, clonidine was then injected  intra-articularly into the knee joint.  The bulky wrap and knee immobilizer placed.  The patient tolerated the procedure well without immediate complication. Freda Munro Vernon's assistance required at all times during the case for retraction, neurovascular protection, opening and closing.  Her assistance was medical necessity.     Anderson Malta, M.D.     GSD/MEDQ  D:  06/25/2013  T:  06/25/2013  Job:  [id number]

## 2013-06-25 NOTE — Progress Notes (Addendum)
ANTICOAGULATION CONSULT NOTE - Initial Consult  Pharmacy Consult for Coumadin Indication: VTE prophylaxis  Allergies  Allergen Reactions  . Percocet [Oxycodone-Acetaminophen] Other (See Comments)    Per patient "it does not agree with me, I do not like the way it makes me feel"    Patient Measurements: Height: 5\' 3"  (160 cm) Weight: 199 lb (90.266 kg) IBW/kg (Calculated) : 52.4  Vital Signs: Temp: 97.4 F (36.3 C) (06/23 1240) Temp src: Oral (06/23 1240) BP: 113/66 mmHg (06/23 1240) Pulse Rate: 76 (06/23 1240)  Labs: No results found for this basename: HGB, HCT, PLT, APTT, LABPROT, INR, HEPARINUNFRC, CREATININE, CKTOTAL, CKMB, TROPONINI,  in the last 72 hours  Estimated Creatinine Clearance: 65.6 ml/min (by C-G formula based on Cr of 1.01).   Medical History: Past Medical History  Diagnosis Date  . Sleep apnea     no cpap  . Cancer     renal cell  . Arthritis   . PONV (postoperative nausea and vomiting)     last couple of surgeries, she has been spared  . Chronic kidney disease     left kidney removed for renal cell carcimona  . Fibromyalgia     Medications:  Prescriptions prior to admission  Medication Sig Dispense Refill  . acetaminophen (TYLENOL) 500 MG tablet Take 500 mg by mouth every 6 (six) hours as needed.      . DULoxetine (CYMBALTA) 30 MG capsule Take 30 mg by mouth daily.      Marland Kitchen HYDROcodone-acetaminophen (NORCO) 10-325 MG per tablet Take 1 tablet by mouth every 4 (four) hours as needed (breakthrough pain).  60 tablet  0  . Magnesium 500 MG TABS Take 500 tablets by mouth daily.      Marland Kitchen OVER THE COUNTER MEDICATION Take 1,000 mg by mouth daily. Cherry extract        Assessment: 58 y/o female to begin Coumadin for VTE prophylaxis s/p L TKA. Preop CBC is normal. Baseline INR is pending.  Goal of Therapy:  INR 2-3 Monitor platelets by anticoagulation protocol: Yes   Plan:  - Coumadin 7.5 mg PO tonight - INR daily - Coumadin book and  video  [facility], Pharm.D., BCPS Clinical Pharmacist Pager: [phone] 06/25/2013 1:55 PM   Addendum: Baseline INR 1.02 which is normal.  Renold Genta, Pharm.D., BCPS Clinical Pharmacist Pager: [phone] 06/25/2013 3:18 PM

## 2013-06-25 NOTE — Brief Op Note (Signed)
06/25/2013  10:31 AM  PATIENT:  Carma Lair  58 y.o. female  PRE-OPERATIVE DIAGNOSIS:  LEFT KNEE OSTEOARTHRITIS  POST-OPERATIVE DIAGNOSIS:  LEFT KNEE OSTEOARTHRITIS  PROCEDURE:  Procedure(s): TOTAL KNEE ARTHROPLASTY  SURGEON:  Surgeon(s): Meredith Pel, MD  ASSISTANT: s vernon  ANESTHESIA:   general  EBL: 50 ml    Total I/O In: 1050 [I.V.:1050] Out: 225 [Urine:175; Blood:50]  BLOOD ADMINISTERED: none  DRAINS: none   LOCAL MEDICATIONS USED:  exparel - marcaine - morphine - clonidine  SPECIMEN:  No Specimen  COUNTS:  YES  TOURNIQUET:   Total Tourniquet Time Documented: Thigh (Left) - 86 minutes Total: Thigh (Left) - 86 minutes   DICTATION: .Other Dictation: Dictation Number [id number]  PLAN OF CARE: Admit to inpatient   PATIENT DISPOSITION:  PACU - hemodynamically stable

## 2013-06-25 NOTE — Transfer of Care (Signed)
Immediate Anesthesia Transfer of Care Note  Patient: Karla Bishop  Procedure(s) Performed: Procedure(s): TOTAL KNEE ARTHROPLASTY (Left)  Patient Location: PACU  Anesthesia Type:General  Level of Consciousness: awake, sedated and patient cooperative  Airway & Oxygen Therapy: Patient Spontanous Breathing and Patient connected to nasal cannula oxygen  Post-op Assessment: Report given to PACU RN and Post -op Vital signs reviewed and stable  Post vital signs: Reviewed and stable  Complications: No apparent anesthesia complications

## 2013-06-26 LAB — CBC
HEMATOCRIT: 33.3 % — AB (ref 36.0–46.0)
HEMOGLOBIN: 10.7 g/dL — AB (ref 12.0–15.0)
MCH: 27.9 pg (ref 26.0–34.0)
MCHC: 32.1 g/dL (ref 30.0–36.0)
MCV: 86.7 fL (ref 78.0–100.0)
Platelets: 174 10*3/uL (ref 150–400)
RBC: 3.84 MIL/uL — ABNORMAL LOW (ref 3.87–5.11)
RDW: 13.4 % (ref 11.5–15.5)
WBC: 6.1 10*3/uL (ref 4.0–10.5)

## 2013-06-26 LAB — PROTIME-INR
INR: 1.09 (ref 0.00–1.49)
PROTHROMBIN TIME: 13.9 s (ref 11.6–15.2)

## 2013-06-26 LAB — BASIC METABOLIC PANEL
BUN: 14 mg/dL (ref 6–23)
CALCIUM: 8.2 mg/dL — AB (ref 8.4–10.5)
CO2: 22 mEq/L (ref 19–32)
Chloride: 100 mEq/L (ref 96–112)
Creatinine, Ser: 1.05 mg/dL (ref 0.50–1.10)
GFR calc non Af Amer: 58 mL/min — ABNORMAL LOW (ref >90)
GFR, EST AFRICAN AMERICAN: 67 mL/min — AB (ref >90)
GLUCOSE: 116 mg/dL — AB (ref 70–99)
Potassium: 4.9 mEq/L (ref 3.7–5.3)
SODIUM: 137 meq/L (ref 137–147)

## 2013-06-26 MED ORDER — WARFARIN SODIUM 7.5 MG PO TABS
7.5000 mg | ORAL_TABLET | Freq: Once | ORAL | Status: AC
  Administered 2013-06-26: 7.5 mg via ORAL
  Filled 2013-06-26: qty 1

## 2013-06-26 NOTE — Progress Notes (Signed)
ANTICOAGULATION CONSULT NOTE - Follow Up Consult  Pharmacy Consult for coumadin Indication: VTE prophylaxis  Allergies  Allergen Reactions  . Percocet [Oxycodone-Acetaminophen] Other (See Comments)    Per patient "it does not agree with me, I do not like the way it makes me feel"    Patient Measurements: Height: 5\' 3"  (160 cm) Weight: 199 lb (90.266 kg) IBW/kg (Calculated) : 52.4   Vital Signs: Temp: 98 F (36.7 C) (06/24 0514) Temp src: Oral (06/24 0514) BP: 110/63 mmHg (06/24 0514) Pulse Rate: 88 (06/24 0514)  Labs:  Recent Labs  06/25/13 1440 06/26/13 0514  HGB  --  10.7*  HCT  --  33.3*  PLT  --  174  LABPROT 13.2 13.9  INR 1.02 1.09  CREATININE  --  1.05    Estimated Creatinine Clearance: 63.1 ml/min (by C-G formula based on Cr of 1.05).  Assessment: Patient is a 58 y.o F on coumadin for VTE prophylaxis s/p L TKA.  INR is 1.09 today after one dose of coumadin given last night.  No bleeding documented.  Goal of Therapy:  INR 2-3    Plan:  1) coumadin 7.5mg  PO x1 today 2) d/c home soon  Pham, Anh P 06/26/2013,10:32 AM

## 2013-06-26 NOTE — Clinical Social Work Note (Signed)
Referred to  CSW for ?SNF. Chart reviewed and have spoken with [facility] and Care Coordinator who indicate patient plans to d/c home with [facility] and DME. CSW to sign off- please contact us if SW needs arise. Eduard Clos, MSW, [place]

## 2013-06-26 NOTE — Care Management Note (Signed)
CARE MANAGEMENT NOTE 06/26/2013  Patient:  DANA, DEBO   Account Number:  [id number]  Date Initiated:  06/25/2013  Documentation initiated by:  Ricki Miller  Subjective/Objective Assessment:   58 yr old female s/p left total knee arthroplasty.     Action/Plan:   Case manager spoke with patient concerning home health and DME needs at discharge. Choice offered. Referral called to [facility] liason. Patient has rolling walker and 3in1. Has family support at discharge.   Anticipated DC Date:  06/27/2013   Anticipated DC Plan:  [place]  CM consult      [facility] Choice  HOME HEALTH  DURABLE MEDICAL EQUIPMENT   Choice offered to / List presented to:  C-3 Spouse   DME arranged  CPM      DME agency  [employer]     HH arranged  HH-2 PT  HH-1 RN      [place].   Status of service:  Completed, signed off Medicare Important Message given?   (If response is "NO", the following Medicare IM given date fields will be blank) Date Medicare IM given:   Date Additional Medicare IM given:    Discharge Disposition:  [place]  Per UR Regulation:  Reviewed for med. necessity/level of care/duration of stay

## 2013-06-26 NOTE — Progress Notes (Signed)
OT Cancellation Note  Patient Details Name: Karla Bishop MRN: [id number] DOB: 1955/03/25   Cancelled Treatment:    Reason Eval/Treat Not Completed: OT screened, no needs identified, will sign off. Pt has all necessary DME/AE at home from prior TKA and is knowledgeable in use.  Her husband will assist as needed with supervision of shower transfer and IADL.  Signing off.  Malka So 06/26/2013, 11:24 AM [phone]

## 2013-06-26 NOTE — Progress Notes (Signed)
Physical Therapy Treatment Patient Details Name: Karla Bishop MRN: [id number] DOB: August 28, 1955 Today's Date: 06/26/2013    History of Present Illness pt presents post L TKA with R TKA ~76yr ago.      PT Comments    Pt continues with nausea and feeling flush limiting mobility.  Feel when nausea resolves pt will make great progress.  Will continue to follow.    Follow Up Recommendations  Home health PT;Supervision for mobility/OOB     Equipment Recommendations  None recommended by PT    Recommendations for Other Services       Precautions / Restrictions Precautions Precautions: Fall Required Braces or Orthoses: Knee Immobilizer - Left Knee Immobilizer - Left: On when out of bed or walking;Discontinue once straight leg raise with < 10 degree lag Restrictions Weight Bearing Restrictions: Yes LLE Weight Bearing: Weight bearing as tolerated    Mobility  Bed Mobility Overal bed mobility: Needs Assistance Bed Mobility: Supine to Sit     Supine to sit: Min assist;HOB elevated     General bed mobility comments: A with L LE only.  pt continues to have HOB elevated ~30 degrees.    Transfers Overall transfer level: Needs assistance Equipment used: Rolling walker (2 wheeled) Transfers: Sit to/from Stand Sit to Stand: Min assist         General transfer comment: cues to control descent to sitting.    Ambulation/Gait Ambulation/Gait assistance: Min guard Ambulation Distance (Feet): 12 Feet (and 30) Assistive device: Rolling walker (2 wheeled) Gait Pattern/deviations: Step-to pattern;Decreased step length - right;Decreased stance time - left;Decreased stride length;Trunk flexed     General Gait Details: pt with feelings of nausea and feeling flush suring amb requiring one seated rest break.  pt indicates knee feels fine, but that nausea and flushness are limiting amb.     Stairs            Wheelchair Mobility    Modified Rankin (Stroke Patients Only)        Balance Overall balance assessment: Needs assistance Sitting-balance support: No upper extremity supported;Feet supported Sitting balance-Leahy Scale: Fair     Standing balance support: Single extremity supported Standing balance-Leahy Scale: Poor                      Cognition Arousal/Alertness: Awake/alert Behavior During Therapy: WFL for tasks assessed/performed Overall Cognitive Status: Within Functional Limits for tasks assessed                      Exercises Total Joint Exercises Ankle Circles/Pumps: AROM;Both;10 reps Quad Sets: AROM;Left;10 reps Long Arc Quad: AAROM;Left;10 reps Knee Flexion: AAROM;Left;10 reps Goniometric ROM: ~5 - 55    General Comments        Pertinent Vitals/Pain 6/10 during ROM.  Premedicated.      Home Living                      Prior Function            PT Goals (current goals can now be found in the care plan section) Acute Rehab PT Goals Patient Stated Goal: Walk normal Time For Goal Achievement: 07/02/13 Potential to Achieve Goals: Good Progress towards PT goals: Progressing toward goals    Frequency  7X/week    PT Plan Current plan remains appropriate    Co-evaluation             End of Session Equipment Utilized During Treatment: Gait  belt;Right knee immobilizer Activity Tolerance: Patient tolerated treatment well Patient left: in chair;with call bell/phone within reach;with family/visitor present     Time: 7218-2883 PT Time Calculation (min): 33 min  Charges:  $Gait Training: 8-22 mins $Therapeutic Exercise: 8-22 mins                    G CodesCatarina Hartshorn, [place] 06/26/2013, 8:29 AM

## 2013-06-26 NOTE — Progress Notes (Signed)
Physical Therapy Treatment Patient Details Name: Karla Bishop MRN: [id number] DOB: 21-Dec-1955 Today's Date: 06/26/2013    History of Present Illness pt presents post L TKA with R TKA ~75yr ago.      PT Comments    Pt able to progress mobility this pm and only had nausea at very end of gait, however pt with increased pain this pm limiting there ex.  Pt placed back in CPM at end of session.  Will continue to follow.    Follow Up Recommendations  Home health PT;Supervision for mobility/OOB     Equipment Recommendations  None recommended by PT    Recommendations for Other Services       Precautions / Restrictions Precautions Precautions: Fall Required Braces or Orthoses: Knee Immobilizer - Left Knee Immobilizer - Left: On when out of bed or walking;Discontinue once straight leg raise with < 10 degree lag Restrictions Weight Bearing Restrictions: Yes LLE Weight Bearing: Weight bearing as tolerated    Mobility  Bed Mobility Overal bed mobility: Needs Assistance Bed Mobility: Supine to Sit;Sit to Supine     Supine to sit: Min guard Sit to supine: Min assist   General bed mobility comments: A with bringing L LE back into bed, but pt able to use R LE to A with bringing L LE OOB.    Transfers Overall transfer level: Needs assistance Equipment used: Rolling walker (2 wheeled) Transfers: Sit to/from Stand Sit to Stand: Min assist         General transfer comment: cues for positioning L LE during transfers and for controlling descent.    Ambulation/Gait Ambulation/Gait assistance: Min guard Ambulation Distance (Feet): 60 Feet (x2) Assistive device: Rolling walker (2 wheeled) Gait Pattern/deviations: Step-to pattern;Decreased step length - right;Decreased stance time - left;Trunk flexed     General Gait Details: pt indicates no nausea until very end of gait this pm.  pt able to increase distance and gait speed.     Stairs            Wheelchair Mobility     Modified Rankin (Stroke Patients Only)       Balance                                    Cognition Arousal/Alertness: Awake/alert Behavior During Therapy: WFL for tasks assessed/performed Overall Cognitive Status: Within Functional Limits for tasks assessed                      Exercises Total Joint Exercises Quad Sets: AROM;Left;10 reps Heel Slides: AAROM;Left;10 reps Hip ABduction/ADduction: AAROM;Left;10 reps Straight Leg Raises: AAROM;Left;10 reps    General Comments        Pertinent Vitals/Pain 7/10 RN called to medicate.      Home Living                      Prior Function            PT Goals (current goals can now be found in the care plan section) Acute Rehab PT Goals Patient Stated Goal: Walk normal Time For Goal Achievement: 07/02/13 Potential to Achieve Goals: Good Progress towards PT goals: Progressing toward goals    Frequency  7X/week    PT Plan Current plan remains appropriate    Co-evaluation             End of Session Equipment Utilized During Treatment:  Gait belt;Right knee immobilizer Activity Tolerance: Patient tolerated treatment well Patient left: in bed;with call bell/phone within reach;with family/visitor present;in CPM     Time: 5038-8828 PT Time Calculation (min): 42 min  Charges:  $Gait Training: 23-37 mins $Therapeutic Exercise: 8-22 mins                    G CodesCatarina Bishop, [place] 06/26/2013, 3:02 PM

## 2013-06-26 NOTE — Progress Notes (Signed)
Subjective: Pt stable - pain ok   Objective: Vital signs in last 24 hours: Temp:  [97.4 F (36.3 C)-99.3 F (37.4 C)] 98 F (36.7 C) (06/24 0514) Pulse Rate:  [72-100] 88 (06/24 0514) Resp:  [11-18] 18 (06/24 0514) BP: (93-132)/(51-70) 110/63 mmHg (06/24 0514) SpO2:  [92 %-100 %] 99 % (06/24 0514)  Intake/Output from previous day: 06/23 0701 - 06/24 0700 In: 3555 [P.O.:560; I.V.:2995] Out: 2250 [Urine:2200; Blood:50] Intake/Output this shift:    Exam:  Sensation intact distally Intact pulses distally Dorsiflexion/Plantar flexion intact  Labs:  Recent Labs  06/26/13 0514  HGB 10.7*    Recent Labs  06/26/13 0514  WBC 6.1  RBC 3.84*  HCT 33.3*  PLT 174    Recent Labs  06/26/13 0514  NA 137  K 4.9  CL 100  CO2 22  BUN 14  CREATININE 1.05  GLUCOSE 116*  CALCIUM 8.2*    Recent Labs  06/25/13 1440 06/26/13 0514  INR 1.02 1.09    Assessment/Plan: PT and CPM for knee range of motion - change dressing tomorrow - possible dc am vs friday   DEAN,GREGORY SCOTT 06/26/2013, 8:33 AM

## 2013-06-27 ENCOUNTER — Encounter (HOSPITAL_COMMUNITY): Payer: Self-pay | Admitting: General Practice

## 2013-06-27 LAB — URINE CULTURE: Colony Count: 60000

## 2013-06-27 LAB — CBC
HEMATOCRIT: 30.3 % — AB (ref 36.0–46.0)
Hemoglobin: 9.9 g/dL — ABNORMAL LOW (ref 12.0–15.0)
MCH: 28.3 pg (ref 26.0–34.0)
MCHC: 32.7 g/dL (ref 30.0–36.0)
MCV: 86.6 fL (ref 78.0–100.0)
PLATELETS: 151 10*3/uL (ref 150–400)
RBC: 3.5 MIL/uL — ABNORMAL LOW (ref 3.87–5.11)
RDW: 13.5 % (ref 11.5–15.5)
WBC: 6.3 10*3/uL (ref 4.0–10.5)

## 2013-06-27 LAB — PROTIME-INR
INR: 1.24 (ref 0.00–1.49)
Prothrombin Time: 15.6 seconds — ABNORMAL HIGH (ref 11.6–15.2)

## 2013-06-27 MED ORDER — METHOCARBAMOL 500 MG PO TABS: 500.0000 mg | ORAL_TABLET | Freq: Four times a day (QID) | ORAL | Status: DC | PRN

## 2013-06-27 MED ORDER — HYDROCODONE-ACETAMINOPHEN 10-325 MG PO TABS: 1.0000 | ORAL_TABLET | ORAL | Status: DC | PRN

## 2013-06-27 MED ORDER — WARFARIN SODIUM 5 MG PO TABS: 5.0000 mg | ORAL_TABLET | Freq: Every day | ORAL | Status: DC

## 2013-06-27 NOTE — Progress Notes (Signed)
Physical Therapy Treatment Patient Details Name: SAUL DORSI MRN: [id number] DOB: July 01, 1955 Today's Date: 06/27/2013    History of Present Illness pt presents post L TKA with R TKA ~15yr ago.      PT Comments    Pt moving very well today and ready for D/C from PT stand point.  Pt/family ed on car transfer and use of KI.  Will continue to follow if remains on acute.    Follow Up Recommendations  Home health PT;Supervision for mobility/OOB     Equipment Recommendations  None recommended by PT    Recommendations for Other Services       Precautions / Restrictions Precautions Precautions: Fall Required Braces or Orthoses: Knee Immobilizer - Left Knee Immobilizer - Left: On when out of bed or walking;Discontinue once straight leg raise with < 10 degree lag Restrictions Weight Bearing Restrictions: Yes LLE Weight Bearing: Weight bearing as tolerated    Mobility  Bed Mobility Overal bed mobility: Needs Assistance Bed Mobility: Supine to Sit     Supine to sit: Supervision     General bed mobility comments: pt able to bring self to EOB without A today.    Transfers Overall transfer level: Needs assistance Equipment used: Rolling walker (2 wheeled) Transfers: Sit to/from Stand Sit to Stand: Supervision         General transfer comment: cues to get closer prior to sitting, but no physical A needed.    Ambulation/Gait Ambulation/Gait assistance: Supervision Ambulation Distance (Feet): 120 Feet (x2) Assistive device: Rolling walker (2 wheeled) Gait Pattern/deviations: Step-to pattern;Decreased step length - right;Decreased stance time - left;Trunk flexed     General Gait Details: pt moving well today without any nausea.  cueing for upright posture only.     Stairs Stairs: Yes Stairs assistance: Min assist Stair Management: No rails;Backwards;With walker Number of Stairs: 3 General stair comments: cues for safe technique with RW.    Wheelchair Mobility     Modified Rankin (Stroke Patients Only)       Balance                                    Cognition Arousal/Alertness: Awake/alert Behavior During Therapy: WFL for tasks assessed/performed Overall Cognitive Status: Within Functional Limits for tasks assessed                      Exercises      General Comments        Pertinent Vitals/Pain 3/10.  Premedicated.      Home Living Family/patient expects to be discharged to:: Private residence Living Arrangements: Spouse/significant other                  Prior Function            PT Goals (current goals can now be found in the care plan section) Acute Rehab PT Goals Patient Stated Goal: Walk normal PT Goal Formulation: With patient Time For Goal Achievement: 07/02/13 Potential to Achieve Goals: Good Progress towards PT goals: Progressing toward goals    Frequency  7X/week    PT Plan Current plan remains appropriate    Co-evaluation             End of Session Equipment Utilized During Treatment: Gait belt;Right knee immobilizer Activity Tolerance: Patient tolerated treatment well Patient left: in chair;with call bell/phone within reach;with family/visitor present     Time: 0762-2633  PT Time Calculation (min): 42 min  Charges:  $Gait Training: 23-37 mins                    G CodesCatarina Hartshorn, [place] 06/27/2013, 8:41 AM

## 2013-06-27 NOTE — Progress Notes (Signed)
Subjective: Pt stable - 60 cpm   Objective: Vital signs in last 24 hours: Temp:  [98 F (36.7 C)-99.3 F (37.4 C)] 98 F (36.7 C) (06/25 0602) Pulse Rate:  [86-98] 98 (06/25 0602) Resp:  [18] 18 (06/25 0602) BP: (115-123)/(49-71) 115/71 mmHg (06/25 0602) SpO2:  [90 %-98 %] 98 % (06/25 0602)  Intake/Output from previous day: 06/24 0701 - 06/25 0700 In: 1036.3 [P.O.:340; I.V.:696.3] Out: -  Intake/Output this shift:    Exam:  Neurovascular intact Sensation intact distally Intact pulses distally  Labs:  Recent Labs  06/26/13 0514 06/27/13 0400  HGB 10.7* 9.9*    Recent Labs  06/26/13 0514 06/27/13 0400  WBC 6.1 6.3  RBC 3.84* 3.50*  HCT 33.3* 30.3*  PLT 174 151    Recent Labs  06/26/13 0514  NA 137  K 4.9  CL 100  CO2 22  BUN 14  CREATININE 1.05  GLUCOSE 116*  CALCIUM 8.2*    Recent Labs  06/26/13 0514 06/27/13 0400  INR 1.09 1.24    Assessment/Plan: Plan pt this am possible dc this afternoon vs am - rx on chart   DEAN,GREGORY SCOTT 06/27/2013, 8:04 AM

## 2013-07-02 NOTE — Discharge Summary (Signed)
Physician Discharge Summary  Patient ID: Karla Bishop MRN: [id number] DOB/AGE: [birth year] 58 y.o.  Admit date: 06/25/2013 Discharge date: 06/27/2013 Admission Diagnoses:  Active Problems:   Arthritis of knee   Discharge Diagnoses:  Same  Surgeries: Procedure(s): TOTAL KNEE ARTHROPLASTY on 06/25/2013   Consultants:    Discharged Condition: Stable  Hospital Course: BAILLIE MOHAMMAD is an 58 y.o. female who was admitted 06/25/2013 with a chief complaint ofknee pain, and found to have a diagnosis of knee arthritis.  They were brought to the operating room on 06/25/2013 and underwent the above named procedures. Tolerated procedure well and was ambulating in hall by discharge   Antibiotics given:  Anti-infectives   Start     Dose/Rate Route Frequency Ordered Stop   06/25/13 1400  ceFAZolin (ANCEF) IVPB 2 g/50 mL premix     2 g 100 mL/hr over 30 Minutes Intravenous 3 times per day 06/25/13 1239 06/25/13 2242   06/25/13 0600  ceFAZolin (ANCEF) IVPB 2 g/50 mL premix     2 g 100 mL/hr over 30 Minutes Intravenous On call to O.R. 06/24/13 1246 06/25/13 0745    .  Recent vital signs:  Filed Vitals:   06/27/13 0602  BP: 115/71  Pulse: 98  Temp: 98 F (36.7 C)  Resp: 18    Recent laboratory studies:  Results for orders placed during the hospital encounter of 06/25/13  URINE CULTURE      Result Value Ref Range   Specimen Description URINE, CLEAN CATCH     Special Requests pre op     Culture  Setup Time       Value: 06/25/2013 08:58     Performed at [facility] Count       Value: 60,000 COLONIES/ML     Performed at [facility]   Culture       Value: VIRIDANS STREPTOCOCCUS     Performed at [facility]   Report Status 06/27/2013 FINAL    PROTIME-INR      Result Value Ref Range   Prothrombin Time 13.2  11.6 - 15.2 seconds   INR 1.02  0.00 - 1.49  PROTIME-INR      Result Value Ref Range   Prothrombin Time 13.9  11.6 - 15.2 seconds   INR  1.09  0.00 - 1.49  CBC      Result Value Ref Range   WBC 6.1  4.0 - 10.5 K/uL   RBC 3.84 (*) 3.87 - 5.11 MIL/uL   Hemoglobin 10.7 (*) 12.0 - 15.0 g/dL   HCT 33.3 (*) 36.0 - 46.0 %   MCV 86.7  78.0 - 100.0 fL   MCH 27.9  26.0 - 34.0 pg   MCHC 32.1  30.0 - 36.0 g/dL   RDW 13.4  11.5 - 15.5 %   Platelets 174  150 - 400 K/uL  BASIC METABOLIC PANEL      Result Value Ref Range   Sodium 137  137 - 147 mEq/L   Potassium 4.9  3.7 - 5.3 mEq/L   Chloride 100  96 - 112 mEq/L   CO2 22  19 - 32 mEq/L   Glucose, Bld 116 (*) 70 - 99 mg/dL   BUN 14  6 - 23 mg/dL   Creatinine, Ser 1.05  0.50 - 1.10 mg/dL   Calcium 8.2 (*) 8.4 - 10.5 mg/dL   GFR calc non Af Amer 58 (*) >90 mL/min   GFR calc Af Amer 67 (*) >  90 mL/min  PROTIME-INR      Result Value Ref Range   Prothrombin Time 15.6 (*) 11.6 - 15.2 seconds   INR 1.24  0.00 - 1.49  CBC      Result Value Ref Range   WBC 6.3  4.0 - 10.5 K/uL   RBC 3.50 (*) 3.87 - 5.11 MIL/uL   Hemoglobin 9.9 (*) 12.0 - 15.0 g/dL   HCT 30.3 (*) 36.0 - 46.0 %   MCV 86.6  78.0 - 100.0 fL   MCH 28.3  26.0 - 34.0 pg   MCHC 32.7  30.0 - 36.0 g/dL   RDW 13.5  11.5 - 15.5 %   Platelets 151  150 - 400 K/uL    Discharge Medications:     Medication List    STOP taking these medications       acetaminophen 500 MG tablet  Commonly known as:  TYLENOL     OVER THE COUNTER MEDICATION      TAKE these medications       DULoxetine 30 MG capsule  Commonly known as:  CYMBALTA  Take 30 mg by mouth daily.     HYDROcodone-acetaminophen 10-325 MG per tablet  Commonly known as:  NORCO  Take 1-2 tablets by mouth every 4 (four) hours as needed (breakthrough pain).     Magnesium 500 MG Tabs  Take 500 tablets by mouth daily.     methocarbamol 500 MG tablet  Commonly known as:  ROBAXIN  Take 1 tablet (500 mg total) by mouth every 6 (six) hours as needed for muscle spasms.     warfarin 5 MG tablet  Commonly known as:  COUMADIN  Take 1 tablet (5 mg total) by mouth  daily.        Diagnostic Studies: Dg Chest 2 View  06/17/2013   CLINICAL DATA:  Preop for left knee replacement  EXAM: CHEST  2 VIEW  COMPARISON:  05/14/2012.  FINDINGS: Cardiomediastinal silhouette is unremarkable. No acute infiltrate or pleural effusion. No pulmonary edema. Mild degenerative changes mid thoracic spine.  IMPRESSION: No active cardiopulmonary disease.   Electronically Signed   By: Lahoma Crocker M.D.   On: 06/17/2013 15:02    Disposition: 01-Home or Self Care      Discharge Instructions   Call MD / Call 911    Complete by:  As directed   If you experience chest pain or shortness of breath, CALL 911 and be transported to the hospital emergency room.  If you develope a fever above 101 F, pus (white drainage) or increased drainage or redness at the wound, or calf pain, call your surgeon's office.     Constipation Prevention    Complete by:  As directed   Drink plenty of fluids.  Prune juice may be helpful.  You may use a stool softener, such as Colace (over the counter) 100 mg twice a day.  Use MiraLax (over the counter) for constipation as needed.     Diet - low sodium heart healthy    Complete by:  As directed      Discharge instructions    Complete by:  As directed   CPM 4 - 6 hours per day - increase daily Keep incision dry first week then ok to shower only with staples in place     Increase activity slowly as tolerated    Complete by:  As directed      Weight bearing as tolerated    Complete by:  As directed  Follow-up Information   Follow up with [facility]. (Someone from [facility] will contact you concerning start date and time for physical therapy.Marland Kitchen)    Contact information:   [address] [place] [address] [phone]        Signed: Meredith Pel 07/02/2013, 11:08 AM

## 2013-07-16 ENCOUNTER — Ambulatory Visit: Payer: 59 | Attending: Orthopedic Surgery | Admitting: Physical Therapy

## 2013-07-16 ENCOUNTER — Ambulatory Visit: Payer: 59 | Admitting: Physical Therapy

## 2013-07-16 DIAGNOSIS — M25669 Stiffness of unspecified knee, not elsewhere classified: Secondary | ICD-10-CM | POA: Insufficient documentation

## 2013-07-16 DIAGNOSIS — M25569 Pain in unspecified knee: Secondary | ICD-10-CM | POA: Diagnosis present

## 2013-07-16 DIAGNOSIS — M25559 Pain in unspecified hip: Secondary | ICD-10-CM | POA: Diagnosis not present

## 2013-07-16 DIAGNOSIS — R609 Edema, unspecified: Secondary | ICD-10-CM | POA: Diagnosis not present

## 2013-07-16 DIAGNOSIS — R262 Difficulty in walking, not elsewhere classified: Secondary | ICD-10-CM | POA: Diagnosis not present

## 2013-07-18 ENCOUNTER — Ambulatory Visit: Payer: 59 | Admitting: Physical Therapy

## 2013-07-18 DIAGNOSIS — M25569 Pain in unspecified knee: Secondary | ICD-10-CM | POA: Diagnosis not present

## 2013-07-19 ENCOUNTER — Ambulatory Visit: Payer: 59 | Admitting: Physical Therapy

## 2013-07-19 DIAGNOSIS — M25569 Pain in unspecified knee: Secondary | ICD-10-CM | POA: Diagnosis not present

## 2013-07-22 ENCOUNTER — Ambulatory Visit: Payer: 59 | Admitting: Physical Therapy

## 2013-07-22 DIAGNOSIS — M25569 Pain in unspecified knee: Secondary | ICD-10-CM | POA: Diagnosis not present

## 2013-07-24 ENCOUNTER — Ambulatory Visit: Payer: 59 | Admitting: Physical Therapy

## 2013-07-24 DIAGNOSIS — M25569 Pain in unspecified knee: Secondary | ICD-10-CM | POA: Diagnosis not present

## 2013-07-26 ENCOUNTER — Ambulatory Visit: Payer: 59 | Admitting: Physical Therapy

## 2013-07-26 DIAGNOSIS — M25569 Pain in unspecified knee: Secondary | ICD-10-CM | POA: Diagnosis not present

## 2013-07-30 ENCOUNTER — Ambulatory Visit: Payer: 59 | Admitting: Physical Therapy

## 2013-07-30 DIAGNOSIS — M25569 Pain in unspecified knee: Secondary | ICD-10-CM | POA: Diagnosis not present

## 2013-08-01 ENCOUNTER — Ambulatory Visit: Payer: 59 | Admitting: Physical Therapy

## 2013-08-01 ENCOUNTER — Encounter (HOSPITAL_COMMUNITY): Payer: Self-pay | Admitting: Emergency Medicine

## 2013-08-01 DIAGNOSIS — M129 Arthropathy, unspecified: Secondary | ICD-10-CM | POA: Diagnosis not present

## 2013-08-01 DIAGNOSIS — N189 Chronic kidney disease, unspecified: Secondary | ICD-10-CM | POA: Diagnosis not present

## 2013-08-01 DIAGNOSIS — Z90710 Acquired absence of both cervix and uterus: Secondary | ICD-10-CM | POA: Diagnosis not present

## 2013-08-01 DIAGNOSIS — R110 Nausea: Secondary | ICD-10-CM | POA: Insufficient documentation

## 2013-08-01 DIAGNOSIS — K5900 Constipation, unspecified: Secondary | ICD-10-CM | POA: Diagnosis present

## 2013-08-01 DIAGNOSIS — Z85528 Personal history of other malignant neoplasm of kidney: Secondary | ICD-10-CM | POA: Diagnosis not present

## 2013-08-01 DIAGNOSIS — T40605A Adverse effect of unspecified narcotics, initial encounter: Secondary | ICD-10-CM | POA: Insufficient documentation

## 2013-08-01 DIAGNOSIS — Z79899 Other long term (current) drug therapy: Secondary | ICD-10-CM | POA: Diagnosis not present

## 2013-08-01 DIAGNOSIS — M25569 Pain in unspecified knee: Secondary | ICD-10-CM | POA: Diagnosis not present

## 2013-08-01 DIAGNOSIS — IMO0001 Reserved for inherently not codable concepts without codable children: Secondary | ICD-10-CM | POA: Insufficient documentation

## 2013-08-01 DIAGNOSIS — K5909 Other constipation: Secondary | ICD-10-CM | POA: Diagnosis not present

## 2013-08-01 LAB — COMPREHENSIVE METABOLIC PANEL
ALT: 13 U/L (ref 0–35)
ANION GAP: 15 (ref 5–15)
AST: 15 U/L (ref 0–37)
Albumin: 3.9 g/dL (ref 3.5–5.2)
Alkaline Phosphatase: 95 U/L (ref 39–117)
BUN: 17 mg/dL (ref 6–23)
CALCIUM: 9.5 mg/dL (ref 8.4–10.5)
CO2: 23 mEq/L (ref 19–32)
CREATININE: 1.06 mg/dL (ref 0.50–1.10)
Chloride: 104 mEq/L (ref 96–112)
GFR calc non Af Amer: 57 mL/min — ABNORMAL LOW (ref >90)
GFR, EST AFRICAN AMERICAN: 66 mL/min — AB (ref >90)
GLUCOSE: 114 mg/dL — AB (ref 70–99)
Potassium: 3.7 mEq/L (ref 3.7–5.3)
Sodium: 142 mEq/L (ref 137–147)
TOTAL PROTEIN: 7.5 g/dL (ref 6.0–8.3)
Total Bilirubin: 0.4 mg/dL (ref 0.3–1.2)

## 2013-08-01 LAB — CBC WITH DIFFERENTIAL/PLATELET
BASOS ABS: 0 10*3/uL (ref 0.0–0.1)
Basophils Relative: 0 % (ref 0–1)
EOS ABS: 0.1 10*3/uL (ref 0.0–0.7)
EOS PCT: 2 % (ref 0–5)
HEMATOCRIT: 38.2 % (ref 36.0–46.0)
Hemoglobin: 12.8 g/dL (ref 12.0–15.0)
LYMPHS PCT: 22 % (ref 12–46)
Lymphs Abs: 1.5 10*3/uL (ref 0.7–4.0)
MCH: 28.1 pg (ref 26.0–34.0)
MCHC: 33.5 g/dL (ref 30.0–36.0)
MCV: 84 fL (ref 78.0–100.0)
MONO ABS: 0.4 10*3/uL (ref 0.1–1.0)
Monocytes Relative: 6 % (ref 3–12)
Neutro Abs: 4.7 10*3/uL (ref 1.7–7.7)
Neutrophils Relative %: 70 % (ref 43–77)
Platelets: 227 10*3/uL (ref 150–400)
RBC: 4.55 MIL/uL (ref 3.87–5.11)
RDW: 13.6 % (ref 11.5–15.5)
WBC: 6.6 10*3/uL (ref 4.0–10.5)

## 2013-08-01 MED ORDER — ONDANSETRON 4 MG PO TBDP
8.0000 mg | ORAL_TABLET | Freq: Once | ORAL | Status: AC
  Administered 2013-08-01: 8 mg via ORAL
  Filled 2013-08-01: qty 2

## 2013-08-01 NOTE — ED Notes (Signed)
Patient here with complaint of constipation which has been ongoing since surgery last month on June 23rd. Has required laxatives and medications to produce bowel movements since that time. Presents tonight because pain has started to get worse and nothing is helping to produce bowel movement. Has tried Mag citrate, small and large volume enemas, miralax, and stool softeners. Endorses very small solid bowel movement yesterday. Additionally states that she has begun to feel nauseated.

## 2013-08-02 ENCOUNTER — Emergency Department (HOSPITAL_COMMUNITY): Payer: 59

## 2013-08-02 ENCOUNTER — Emergency Department (HOSPITAL_COMMUNITY)
Admission: EM | Admit: 2013-08-02 | Discharge: 2013-08-02 | Disposition: A | Discharge status: Home / Self Care | Payer: 59 | Attending: Emergency Medicine | Admitting: Emergency Medicine

## 2013-08-02 DIAGNOSIS — T402X5A Adverse effect of other opioids, initial encounter: Secondary | ICD-10-CM

## 2013-08-02 DIAGNOSIS — K5903 Drug induced constipation: Secondary | ICD-10-CM

## 2013-08-02 MED ORDER — PEG 3350-KCL-NABCB-NACL-NASULF 236 G PO SOLR: 4.0000 L | Freq: Once | ORAL | Status: AC

## 2013-08-02 NOTE — Discharge Instructions (Signed)
Constipation °Constipation is when a person has fewer than three bowel movements a week, has difficulty having a bowel movement, or has stools that are dry, hard, or larger than normal. As people grow older, constipation is more common. If you try to fix constipation with medicines that make you have a bowel movement (laxatives), the problem may get worse. Long-term laxative use may cause the muscles of the colon to become weak. A low-fiber diet, not taking in enough fluids, and taking certain medicines may make constipation worse.  °CAUSES  °· Certain medicines, such as antidepressants, pain medicine, iron supplements, antacids, and water pills.   °· Certain diseases, such as diabetes, irritable bowel syndrome (IBS), thyroid disease, or depression.   °· Not drinking enough water.   °· Not eating enough fiber-rich foods.   °· Stress or travel.   °· Lack of physical activity or exercise.   °· Ignoring the urge to have a bowel movement.   °· Using laxatives too much.   °SIGNS AND SYMPTOMS  °· Having fewer than three bowel movements a week.   °· Straining to have a bowel movement.   °· Having stools that are hard, dry, or larger than normal.   °· Feeling full or bloated.   °· Pain in the lower abdomen.   °· Not feeling relief after having a bowel movement.   °DIAGNOSIS  °Your health care provider will take a medical history and perform a physical exam. Further testing may be done for severe constipation. Some tests may include: °· A barium enema X-ray to examine your rectum, colon, and, sometimes, your small intestine.   °· A sigmoidoscopy to examine your lower colon.   °· A colonoscopy to examine your entire colon. °TREATMENT  °Treatment will depend on the severity of your constipation and what is causing it. Some dietary treatments include drinking more fluids and eating more fiber-rich foods. Lifestyle treatments may include regular exercise. If these diet and lifestyle recommendations do not help, your health care  provider may recommend taking over-the-counter laxative medicines to help you have bowel movements. Prescription medicines may be prescribed if over-the-counter medicines do not work.  °HOME CARE INSTRUCTIONS  °· Eat foods that have a lot of fiber, such as fruits, vegetables, whole grains, and beans. °· Limit foods high in fat and processed sugars, such as french fries, hamburgers, cookies, candies, and soda.   °· A fiber supplement may be added to your diet if you cannot get enough fiber from foods.   °· Drink enough fluids to keep your urine clear or pale yellow.   °· Exercise regularly or as directed by your health care provider.   °· Go to the restroom when you have the urge to go. Do not hold it.   °· Only take over-the-counter or prescription medicines as directed by your health care provider. Do not take other medicines for constipation without talking to your health care provider first.   °SEEK IMMEDIATE MEDICAL CARE IF:  °· You have bright red blood in your stool.   °· Your constipation lasts for more than 4 days or gets worse.   °· You have abdominal or rectal pain.   °· You have thin, pencil-like stools.   °· You have unexplained weight loss. °MAKE SURE YOU:  °· Understand these instructions. °· Will watch your condition. °· Will get help right away if you are not doing well or get worse. °Document Released: 09/18/2003 Document Revised: 12/25/2012 Document Reviewed: 10/01/2012 °ExitCare® Patient Information ©2015 [employer]. This information is not intended to replace advice given to you by your health care provider. Make sure you discuss any questions   you have with your health care provider.  Polyethylene Glycol; Electrolytes oral solution What is this medicine? POLYETHYLENE GLYCOL; ELECTROLYTES (pol ee ETH i leen GLYE col; i LEK truh lahyts) is a laxative. It is used to clean out the bowel before a colonoscopy. This medicine may be used for other purposes; ask your health care provider or  pharmacist if you have questions. COMMON BRAND NAME(S): Colyte, GaviLyte-C, GaviLyte-G, GaviLyte-N, GoLYTELY, NuLYTELY, OCL, Suclear, TriLyte What should I tell my health care provider before I take this medicine? They need to know if you have any of these conditions: -any chronic disease of the intestine, stomach, or throat -bloating or pain in stomach area -difficulty swallowing -G6PD deficiency -heart disease -kidney disease -low levels of sodium in the blood -phenylketonuria -seizures -an unusual or allergic reaction to polyethylene glycol, other medicines, dyes, or preservatives -pregnant or trying to get pregnant -breast-feeding How should I use this medicine? Take this medicine by mouth. Before using this medicine you or your pharmacist must fill the container with the amount of water indicated on the package label. Chill solution before using to improve taste. Shake well before each dose. Your doctor will tell you what time to start this medicine. Take exactly as directed. You will usually have the first bowel movement about 1 hour after you begin drinking the solution. After that, you will have frequent watery bowel movements. You will need to follow a special diet before your procedure. Talk to your doctor. Do not eat any solid foods for 3 to 4 hours before taking this medicine. A special MedGuide will be given to you by the pharmacist with each prescription and refill. Be sure to read this information carefully each time. Talk to your pediatrician regarding the use of this medicine in children. Special care may be needed. Overdosage: If you think you have taken too much of this medicine contact a poison control center or emergency room at once. NOTE: This medicine is only for you. Do not share this medicine with others. What if I miss a dose? You should talk to your doctor if you are not able to complete the bowel preparation as advised. What may interact with this medicine? Do  not take any other medicine by mouth starting one hour before you take this medicine. Talk to your doctor about when to take your other medicines. This medicine may interact with the following medications: -certain medicines for blood pressure, heart disease, irregular heart beat -certain medicines for kidney disease -certain medicines for seizures like carbamazepine, phenobarbital, phenytoin -diuretics -laxatives -NSAIDS, medicines for pain and inflammation, like ibuprofen or naproxen This list may not describe all possible interactions. Give your health care provider a list of all the medicines, herbs, non-prescription drugs, or dietary supplements you use. Also tell them if you smoke, drink alcohol, or use illegal drugs. Some items may interact with your medicine. What should I watch for while using this medicine? Tell your doctor if you experience any changes in bowel habits that are severe or last longer than three weeks. Do not inhale dust from the solution powder. This can make breathing difficult or may cause sneezing or an allergic-type reaction. Bloating may occur before the first bowel movement. If your discomfort continues while you are drinking the solution, stop drinking the solution temporarily or drink each portion with longer spaces in between until your symptoms disappear. Contact your doctor or health care professional if you have any concerns. What side effects may I notice from receiving  this medicine? Side effects that you should report to your doctor or health care professional as soon as possible: -allergic reactions like skin rash, itching or hives, swelling of the face, lips, or tongue -breathing problems -chest tightness -dizziness -fast, irregular heartbeat -headache -seizures -trouble passing urine or change in the amount of urine -vomiting Side effects that usually do not require medical attention (report to your doctor or health care professional if they  continue or are bothersome): -anal irritation -bloating, pain, or distension of the stomach -chills -increased hunger or thirst -nausea -stomach gas -tiredness -trouble sleeping This list may not describe all possible side effects. Call your doctor for medical advice about side effects. You may report side effects to FDA at 1-800-FDA-1088. Where should I keep my medicine? Keep out of the reach of children. Store the solution in the refrigerator to improve the taste. Do not freeze. Throw away any unused medicine 48 hours after mixing. NOTE: This sheet is a summary. It may not cover all possible information. If you have questions about this medicine, talk to your doctor, pharmacist, or health care provider.  2015, Elsevier/Gold Standard. (2009-05-21 12:51:08)

## 2013-08-02 NOTE — ED Provider Notes (Signed)
CSN: [id number]     Arrival date & time 08/01/13  2228 History   First MD Initiated Contact with Patient 08/02/13 0128     Chief Complaint  Patient presents with  . Constipation  . Abdominal Pain  . Nausea     (Consider location/radiation/quality/duration/timing/severity/associated sxs/prior Treatment) Patient is a 58 y.o. female presenting with constipation and abdominal pain. The history is provided by the patient.  Constipation Associated symptoms: abdominal pain   Abdominal Pain Associated symptoms: constipation   She has been having difficulty with constipation since being discharged from the hospital following knee replacement about one month ago. She had been on narcotics and continues to be on narcotics although she is down to taking only one a day. She has chronic constipation but symptoms are much worse. She has taken stool softeners, MiraLAX, and she used fleets enemas and glycerin suppositories and only produces a small amount of stool at that time and never which he considers to be a good bowel movement. She has been having some cramping in the left lower abdomen which is worse when she tries to move her bowels and sometimes pain seems to radiate toward the rectum. She denies fever or chills and denies nausea vomiting.  Past Medical History  Diagnosis Date  . Sleep apnea     no cpap  . Cancer     renal cell  . Arthritis   . PONV (postoperative nausea and vomiting)     last couple of surgeries, she has been spared  . Chronic kidney disease     left kidney removed for renal cell carcimona  . Fibromyalgia    Past Surgical History  Procedure Laterality Date  . Breast surgery Bilateral     brest reduction  . Abdominal hysterectomy    . Knee arthroscopic Bilateral   . Kidney removed Left   . Removal ovaries Bilateral     at 2 diffierent times due to cysts  . Total knee arthroplasty Right 05/22/2012    Procedure: TOTAL KNEE ARTHROPLASTY- right;  Surgeon: Meredith Pel, MD;  Location: [facility];  Service: Orthopedics;  Laterality: Right;  Right Total Knee Arthroplasty   . Total knee arthroplasty Left 06/25/2013    dr dean  . Total knee arthroplasty Left 06/25/2013    Procedure: TOTAL KNEE ARTHROPLASTY;  Surgeon: Meredith Pel, MD;  Location: [facility];  Service: Orthopedics;  Laterality: Left;   No family history on file. History  Substance Use Topics  . Smoking status: Never Smoker   . Smokeless tobacco: Never Used  . Alcohol Use: No   OB History   Grav Para Term Preterm Abortions TAB SAB Ect Mult Living                 Review of Systems  Gastrointestinal: Positive for abdominal pain and constipation.  All other systems reviewed and are negative.     Allergies  Percocet  Home Medications   Prior to Admission medications   Medication Sig Start Date End Date Taking? Authorizing Provider  acetaminophen (TYLENOL) 650 MG CR tablet Take 650 mg by mouth every 8 (eight) hours as needed for pain.   Yes Historical Provider, MD  cyclobenzaprine (FLEXERIL) 5 MG tablet Take 5 mg by mouth daily.   Yes Historical Provider, MD  docusate sodium (COLACE) 100 MG capsule Take 100 mg by mouth daily.   Yes Historical Provider, MD  DULoxetine (CYMBALTA) 30 MG capsule Take 30 mg by mouth daily.   Yes  Historical Provider, MD  Magnesium 500 MG TABS Take 500 tablets by mouth daily.   Yes Historical Provider, MD  methocarbamol (ROBAXIN) 500 MG tablet Take 500 mg by mouth daily.   Yes Historical Provider, MD  Misc Natural Products (TART CHERRY ADVANCED PO) Take 1 capsule by mouth daily.   Yes Historical Provider, MD   BP 112/59  Pulse 80  Temp(Src) 97.9 F (36.6 C) (Oral)  Resp 21  Ht 5\' 3"  (1.6 m)  Wt 190 lb (86.183 kg)  BMI 33.67 kg/m2  SpO2 100% Physical Exam  Nursing note and vitals reviewed.  58 year old female, resting comfortably and in no acute distress. Vital signs are normal. Oxygen saturation is 100%, which is normal. Head is normocephalic and  atraumatic. PERRLA, EOMI. Oropharynx is clear. Neck is nontender and supple without adenopathy or JVD. Back is nontender and there is no CVA tenderness. Lungs are clear without rales, wheezes, or rhonchi. Chest is nontender. Heart has regular rate and rhythm without murmur. Abdomen is soft, flat, nontender without masses or hepatosplenomegaly and peristalsis is normoactive. Rectal: Normal sphincter tone, no impaction present. Extremities have no cyanosis or edema, full range of motion is present. Skin is warm and dry without rash. Neurologic: Mental status is normal, cranial nerves are intact, there are no motor or sensory deficits.  ED Course  Procedures (including critical care time) Labs Review Results for orders placed during the hospital encounter of 08/02/13  CBC WITH DIFFERENTIAL      Result Value Ref Range   WBC 6.6  4.0 - 10.5 K/uL   RBC 4.55  3.87 - 5.11 MIL/uL   Hemoglobin 12.8  12.0 - 15.0 g/dL   HCT 38.2  36.0 - 46.0 %   MCV 84.0  78.0 - 100.0 fL   MCH 28.1  26.0 - 34.0 pg   MCHC 33.5  30.0 - 36.0 g/dL   RDW 13.6  11.5 - 15.5 %   Platelets 227  150 - 400 K/uL   Neutrophils Relative % 70  43 - 77 %   Neutro Abs 4.7  1.7 - 7.7 K/uL   Lymphocytes Relative 22  12 - 46 %   Lymphs Abs 1.5  0.7 - 4.0 K/uL   Monocytes Relative 6  3 - 12 %   Monocytes Absolute 0.4  0.1 - 1.0 K/uL   Eosinophils Relative 2  0 - 5 %   Eosinophils Absolute 0.1  0.0 - 0.7 K/uL   Basophils Relative 0  0 - 1 %   Basophils Absolute 0.0  0.0 - 0.1 K/uL  COMPREHENSIVE METABOLIC PANEL      Result Value Ref Range   Sodium 142  137 - 147 mEq/L   Potassium 3.7  3.7 - 5.3 mEq/L   Chloride 104  96 - 112 mEq/L   CO2 23  19 - 32 mEq/L   Glucose, Bld 114 (*) 70 - 99 mg/dL   BUN 17  6 - 23 mg/dL   Creatinine, Ser 1.06  0.50 - 1.10 mg/dL   Calcium 9.5  8.4 - 10.5 mg/dL   Total Protein 7.5  6.0 - 8.3 g/dL   Albumin 3.9  3.5 - 5.2 g/dL   AST 15  0 - 37 U/L   ALT 13  0 - 35 U/L   Alkaline Phosphatase  95  39 - 117 U/L   Total Bilirubin 0.4  0.3 - 1.2 mg/dL   GFR calc non Af Amer 57 (*) >90 mL/min  GFR calc Af Amer 66 (*) >90 mL/min   Anion gap 15  5 - 15   Imaging Review Dg Abd 1 View  08/02/2013   CLINICAL DATA:  Constipation and shortness of breath. Abdominal pain and nausea.  EXAM: ABDOMEN - 1 VIEW  COMPARISON:  CT abdomen and pelvis 08/01/2005  FINDINGS: Surgical clips in the left upper quadrant. The bowel gas pattern is normal. No radio-opaque calculi or other significant radiographic abnormality are seen.  IMPRESSION: Negative.   Electronically Signed   By: Lucienne Capers M.D.   On: 08/02/2013 02:07   Images viewed by me.  MDM   Final diagnoses:  Constipation due to opioid therapy    Constipation which is probably related to narcotic use following surgery. KUB will be obtained to evaluate stool burden and she'll be discharged with prescription for GoLYTELY. Old records are reviewed confirming that she did have total knee arthroplasty one month ago and was discharged with prescription for hydrocodone-acetaminophen 10-325.  Abdominal x-ray shows a moderate stool burden. Discharge is as noted above and followup with PCP.  Delora Fuel, MD 54/49/20 1007

## 2013-08-02 NOTE — ED Notes (Signed)
Pt transported Xray.

## 2013-08-05 ENCOUNTER — Ambulatory Visit: Payer: 59 | Attending: Orthopedic Surgery | Admitting: Physical Therapy

## 2013-08-05 DIAGNOSIS — M25559 Pain in unspecified hip: Secondary | ICD-10-CM | POA: Diagnosis not present

## 2013-08-05 DIAGNOSIS — R609 Edema, unspecified: Secondary | ICD-10-CM | POA: Insufficient documentation

## 2013-08-05 DIAGNOSIS — M25669 Stiffness of unspecified knee, not elsewhere classified: Secondary | ICD-10-CM | POA: Insufficient documentation

## 2013-08-05 DIAGNOSIS — M25569 Pain in unspecified knee: Secondary | ICD-10-CM | POA: Diagnosis present

## 2013-08-05 DIAGNOSIS — R262 Difficulty in walking, not elsewhere classified: Secondary | ICD-10-CM | POA: Diagnosis not present

## 2013-08-07 ENCOUNTER — Ambulatory Visit: Payer: 59 | Admitting: Physical Therapy

## 2013-08-07 DIAGNOSIS — M25569 Pain in unspecified knee: Secondary | ICD-10-CM | POA: Diagnosis not present

## 2013-08-09 ENCOUNTER — Ambulatory Visit: Payer: 59 | Admitting: Physical Therapy

## 2013-08-09 DIAGNOSIS — M25569 Pain in unspecified knee: Secondary | ICD-10-CM | POA: Diagnosis not present

## 2013-08-12 ENCOUNTER — Encounter: Payer: 59 | Admitting: Physical Therapy

## 2013-08-13 ENCOUNTER — Ambulatory Visit: Payer: 59 | Admitting: Physical Therapy

## 2013-08-13 DIAGNOSIS — M25569 Pain in unspecified knee: Secondary | ICD-10-CM | POA: Diagnosis not present

## 2013-08-15 ENCOUNTER — Ambulatory Visit: Payer: 59 | Admitting: Physical Therapy

## 2013-08-16 ENCOUNTER — Ambulatory Visit: Payer: 59 | Admitting: Physical Therapy

## 2013-08-16 DIAGNOSIS — M25569 Pain in unspecified knee: Secondary | ICD-10-CM | POA: Diagnosis not present

## 2013-08-20 ENCOUNTER — Ambulatory Visit: Payer: 59 | Admitting: Physical Therapy

## 2013-08-20 DIAGNOSIS — M25569 Pain in unspecified knee: Secondary | ICD-10-CM | POA: Diagnosis not present

## 2013-08-22 ENCOUNTER — Ambulatory Visit: Payer: 59 | Admitting: Physical Therapy

## 2013-08-22 DIAGNOSIS — M25569 Pain in unspecified knee: Secondary | ICD-10-CM | POA: Diagnosis not present

## 2013-08-28 ENCOUNTER — Ambulatory Visit: Payer: 59 | Admitting: Physical Therapy

## 2013-08-29 ENCOUNTER — Ambulatory Visit: Payer: 59 | Admitting: Physical Therapy

## 2013-08-29 DIAGNOSIS — M25569 Pain in unspecified knee: Secondary | ICD-10-CM | POA: Diagnosis not present

## 2013-08-30 ENCOUNTER — Ambulatory Visit: Payer: 59 | Admitting: Physical Therapy

## 2013-09-03 ENCOUNTER — Ambulatory Visit: Payer: 59 | Attending: Orthopedic Surgery | Admitting: Physical Therapy

## 2013-09-03 DIAGNOSIS — M25569 Pain in unspecified knee: Secondary | ICD-10-CM | POA: Diagnosis present

## 2013-09-03 DIAGNOSIS — M25559 Pain in unspecified hip: Secondary | ICD-10-CM | POA: Diagnosis not present

## 2013-09-03 DIAGNOSIS — R262 Difficulty in walking, not elsewhere classified: Secondary | ICD-10-CM | POA: Insufficient documentation

## 2013-09-03 DIAGNOSIS — M25669 Stiffness of unspecified knee, not elsewhere classified: Secondary | ICD-10-CM | POA: Diagnosis not present

## 2013-09-03 DIAGNOSIS — R609 Edema, unspecified: Secondary | ICD-10-CM | POA: Insufficient documentation

## 2013-09-10 ENCOUNTER — Ambulatory Visit: Payer: 59 | Admitting: Physical Therapy

## 2014-09-04 ENCOUNTER — Ambulatory Visit: Payer: 59 | Attending: Orthopedic Surgery | Admitting: Physical Therapy

## 2014-09-04 DIAGNOSIS — M6281 Muscle weakness (generalized): Secondary | ICD-10-CM | POA: Diagnosis present

## 2014-09-04 DIAGNOSIS — R262 Difficulty in walking, not elsewhere classified: Secondary | ICD-10-CM

## 2014-09-04 DIAGNOSIS — M25562 Pain in left knee: Secondary | ICD-10-CM | POA: Diagnosis present

## 2014-09-04 DIAGNOSIS — M25561 Pain in right knee: Secondary | ICD-10-CM | POA: Diagnosis not present

## 2014-09-04 NOTE — Patient Instructions (Signed)

## 2014-09-05 NOTE — Therapy (Signed)
[place], [place], [address] Phone: [phone]   Fax:  [phone]  Physical Therapy Evaluation  Patient Details  Name: Karla Bishop MRN: [id number] Date of Birth: 31-Aug-1955 Referring Provider:  Meredith Pel, MD  Encounter Date: 09/04/2014      PT End of Session - 09/05/14 1203    Visit Number 1   Number of Visits 16   Date for PT Re-Evaluation 10/30/14   Authorization Type UHC   PT Start Time 1500   PT Stop Time 1545   PT Time Calculation (min) 45 min   Activity Tolerance Patient tolerated treatment well      Past Medical History  Diagnosis Date  . Sleep apnea     no cpap  . Cancer     renal cell  . Arthritis   . PONV (postoperative nausea and vomiting)     last couple of surgeries, she has been spared  . Chronic kidney disease     left kidney removed for renal cell carcimona  . Fibromyalgia     Past Surgical History  Procedure Laterality Date  . Breast surgery Bilateral     brest reduction  . Abdominal hysterectomy    . Knee arthroscopic Bilateral   . Kidney removed Left   . Removal ovaries Bilateral     at 2 diffierent times due to cysts  . Total knee arthroplasty Right 05/22/2012    Procedure: TOTAL KNEE ARTHROPLASTY- right;  Surgeon: Meredith Pel, MD;  Location: [facility];  Service: Orthopedics;  Laterality: Right;  Right Total Knee Arthroplasty   . Total knee arthroplasty Left 06/25/2013    dr dean  . Total knee arthroplasty Left 06/25/2013    Procedure: TOTAL KNEE ARTHROPLASTY;  Surgeon: Meredith Pel, MD;  Location: [facility];  Service: Orthopedics;  Laterality: Left;    There were no vitals filed for this visit.  Visit Diagnosis:  Bilateral knee pain - Plan: PT plan of care cert/re-cert  Muscle weakness of lower extremity - Plan: PT plan of care cert/re-cert  Difficulty in walking - Plan: PT plan of care cert/re-cert      Subjective Assessment - 09/04/14  1501    Subjective Complains of B anterior knee pain especially with weight bearing or after riding the bike or Elliptical; May 2014, June 2015 TKR;  history of bulging disc left better with injection and aggravated every once in a while with bending.     Pertinent History kidney CA 1 kidney 2000; fibromyalgia   Limitations Walking   How long can you sit comfortably? long periods of time especially with legs straight   How long can you stand comfortably? 3 min   How long can you walk comfortably? 1/2 mile but not comfortably   Diagnostic tests x-rays "looked good"   Patient Stated Goals be able to walk without pain and greater ease with sit to stand   Currently in Pain? Yes   Pain Score 0-No pain   Pain Location Knee   Pain Orientation Right;Left   Pain Type Chronic pain   Pain Onset More than a month ago   Pain Frequency Intermittent   Aggravating Factors  standing, walking;  after sitting a long time; after 30 min on Elliptical   Pain Relieving Factors aquatics            [facility] PT Assessment - 09/04/14 1508    Assessment   Medical Diagnosis B knee pain  Onset Date/Surgical Date --  June 2015   Hand Dominance Right   Next MD Visit none   Prior Therapy 1 year ago   Precautions   Precautions None   Restrictions   Weight Bearing Restrictions No   Balance Screen   Has the patient fallen in the past 6 months Yes   How many times? 1  at work   Has the patient had a decrease in activity level because of a fear of falling?  No   Is the patient reluctant to leave their home because of a fear of falling?  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   Leisure gym, garden (bend from my waist)   Observation/Other Assessments   Focus on Therapeutic Outcomes (FOTO)  not captured    Lower Extremity Functional Scale  next visit   ROM / Strength   AROM / PROM / Strength AROM;Strength   AROM   AROM Assessment Site Knee   Right/Left Knee Right;Left   Right Knee Extension 1   Right Knee Flexion 125   Left Knee Extension 0   Left Knee Flexion 128   Strength   Strength Assessment Site Knee;Hip   Right/Left Knee Right;Left   Right Knee Flexion 4+/5   Right Knee Extension 4/5   Left Knee Flexion 4+/5   Left Knee Extension 4/5   Flexibility   Soft Tissue Assessment /Muscle Length yes   Hamstrings 85   Quadriceps decreased length   Palpation   Patella mobility hypomobility right > left   Palpation comment Tender points in Right > left vastus lateralis and rectus femoris                   [facility] Adult PT Treatment/Exercise - 09/05/14 0001    Moist Heat Therapy   Number Minutes Moist Heat 10 Minutes   Moist Heat Location Knee   Manual Therapy   Soft tissue mobilization Bilateral quads, HS, ITB, gastroc          Trigger Point Dry Needling - 09/05/14 1201    Consent Given? Yes   Education Handout Provided Yes   Muscles Treated Lower Body Quadriceps   Quadriceps Response Twitch response elicited;Palpable increased muscle length              PT Education - 09/05/14 1203    Education provided Yes   Education Details self care with dry needling   Person(s) Educated Patient   Methods Explanation;Handout   Comprehension Verbalized understanding          PT Short Term Goals - 09/05/14 1215    PT SHORT TERM GOAL #1   Title The patient will be instructed in basic stretching, strengthening and self care strategies to faciliate progress with rehab   Time 4   Period Weeks   Status New   PT SHORT TERM GOAL #2   Title The patient will report a 25% improvement in pain and function   Time 4   Period Weeks   Status New   PT SHORT TERM GOAL #3   Title The patient will be able to walk 6-7 minutes at work, shopping with min complaint of pain    Time 4   Period Weeks  Status New           PT Long Term Goals - 09/05/14 1218    PT LONG TERM GOAL #1   Title The patient will be independent in safe self progression of HEP and gym program as well as self care techniques for independent management   Time 8   Period Weeks   Status New   PT LONG TERM GOAL #2   Title The patient will report a 50% improvement in pain and function   Time 8   Period Weeks   Status New   PT LONG TERM GOAL #3   Title The patient will be able to walk 10 min with min complaint of pain   Baseline 3 min on eval   Time 8   Period Weeks   Status New   PT LONG TERM GOAL #4   Title The patient will have LEFS score of at least 50/80 indicating improved function with less pain   Time 8   Period Weeks   Status New               Plan - 09/05/14 1206    Clinical Impression Statement The patient is a 59 year old female who presents with complains of bilateral knee pain.  She is s/p B TKRs on May 2014 and June 2015.  She reports a normal course of rehab.  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  Her pain is primarily with standing and walking which is limited to 3 min comfortably.   She states she could walk 1/2 mile at work but her pain would be severe.  She also has pain after getting off the Elliptical and with rising from a chair.  Her knee AROM is good:  right 1-125 degrees, left 0-128 degrees.  Quad strength 4/5 B; HS 4+/5 B.  HS length 85 degrees B; Decreased patellar mobility on right.  Tender points right > left vastus lateralis, rectus femoris.  The patient would benefit from PT to address pain to improve function.     Pt will benefit from skilled therapeutic intervention in order to improve on the following deficits Pain;Difficulty walking;Increased muscle spasms;Decreased strength;Decreased  mobility   Rehab Potential Good   PT Frequency 2x / week   PT Duration 8 weeks   PT Treatment/Interventions ADLs/Self Care Home Management;Cryotherapy;Electrical Stimulation;Moist Heat;Therapeutic activities;Therapeutic exercise;Patient/family education;Dry needling;Manual techniques;Scar mobilization   PT Next Visit Plan assess response to dry needling and continue if helpful;  instruct in quad stretch; instrument assisted soft tissue/myofascial work;  patellar mobilization; ? step downs; do LEFS; ?6 MWT         Problem List Patient Active Problem List   Diagnosis Date Noted  . Arthritis of knee 06/25/2013    Alvera Singh 09/05/2014, 12:24 PM  [facility] [address] [place], [place], [address] Phone: [phone]   Fax:  [phone]   Ruben Im, PT 09/05/2014 12:24 PM Phone: [phone] Fax: [phone]

## 2014-09-16 ENCOUNTER — Ambulatory Visit: Payer: 59 | Admitting: Physical Therapy

## 2014-09-16 DIAGNOSIS — M25561 Pain in right knee: Secondary | ICD-10-CM

## 2014-09-16 DIAGNOSIS — M25562 Pain in left knee: Principal | ICD-10-CM

## 2014-09-16 DIAGNOSIS — M6281 Muscle weakness (generalized): Secondary | ICD-10-CM

## 2014-09-16 DIAGNOSIS — R262 Difficulty in walking, not elsewhere classified: Secondary | ICD-10-CM

## 2014-09-16 NOTE — Patient Instructions (Signed)
Quads / HF, Standing   Stand, holding onto chair and grasping one foot with same-side hand. Pull heel toward buttock. Hold 30-60___ seconds.  Repeat _2-3__ times per session. Do _2-3__ sessions per day.  Copyright  VHI. All rights reserved.  Hilbert Corrigan: Quadriceps - Prone   Place strap around ankle. Bring ankle toward buttocks. Press hip into surface. Hold 30-60___ seconds. 2-3___ reps per set, _1__ sets per day, _7__ days per week   Copyright  VHI. All rights reserved.  May use hot pack before you stretch for more comfort and tissue pliability.  Voncille Lo, PT 09/16/2014 2:30 PM Phone: [phone] Fax: [phone]

## 2014-09-16 NOTE — Therapy (Signed)
[place], [place], [address] Phone: [phone]   Fax:  [phone]  Physical Therapy Treatment  Patient Details  Name: Karla Bishop MRN: [id number] Date of Birth: 06-Aug-1955 Referring Provider:  Florina Ou, MD  Encounter Date: 09/16/2014      PT End of Session - 09/16/14 1758    Visit Number 2   Number of Visits 16   Date for PT Re-Evaluation 10/30/14   Authorization Type UHC   PT Start Time 0216   PT Stop Time 0315   PT Time Calculation (min) 59 min   Activity Tolerance Patient tolerated treatment well   Behavior During Therapy [facility] for tasks assessed/performed      Past Medical History  Diagnosis Date  . Sleep apnea     no cpap  . Cancer     renal cell  . Arthritis   . PONV (postoperative nausea and vomiting)     last couple of surgeries, she has been spared  . Chronic kidney disease     left kidney removed for renal cell carcimona  . Fibromyalgia     Past Surgical History  Procedure Laterality Date  . Breast surgery Bilateral     brest reduction  . Abdominal hysterectomy    . Knee arthroscopic Bilateral   . Kidney removed Left   . Removal ovaries Bilateral     at 2 diffierent times due to cysts  . Total knee arthroplasty Right 05/22/2012    Procedure: TOTAL KNEE ARTHROPLASTY- right;  Surgeon: Meredith Pel, MD;  Location: [facility];  Service: Orthopedics;  Laterality: Right;  Right Total Knee Arthroplasty   . Total knee arthroplasty Left 06/25/2013    dr dean  . Total knee arthroplasty Left 06/25/2013    Procedure: TOTAL KNEE ARTHROPLASTY;  Surgeon: Meredith Pel, MD;  Location: [facility];  Service: Orthopedics;  Laterality: Left;    There were no vitals filed for this visit.  Visit Diagnosis:  Bilateral knee pain  Muscle weakness of lower extremity  Difficulty in walking      Subjective Assessment - 09/16/14 1420    Subjective I feel so much better.  I felt better  even before I left.  Today has been my best day.   Pertinent History kidney CA 1 kidney 2000; fibromyalgia   How long can you sit comfortably? long periods of time especially with legs straight   How long can you stand comfortably? 20 min   How long can you walk comfortably? 1/4 mile better and more comfortable   Patient Stated Goals be able to walk without pain and greater ease with sit to stand   Currently in Pain? Yes   Pain Score 1    Pain Location Knee   Pain Orientation Right;Left   Pain Descriptors / Indicators Sore;Tightness   Pain Type Chronic pain   Pain Onset More than a month ago   Pain Frequency Intermittent            [facility] PT Assessment - 09/16/14 1425    Observation/Other Assessments   Lower Extremity Functional Scale  30/80   AROM   Right Knee Extension 1   Right Knee Flexion 130   Left Knee Extension 0   Left Knee Flexion 128                     [facility] Adult PT Treatment/Exercise - 09/16/14 1426    Knee/Hip Exercises: Stretches  Quad Stretch 3 reps;30 seconds  bil prone and standing    Moist Heat Therapy   Number Minutes Moist Heat 15 Minutes   Moist Heat Location Knee  bil   Manual Therapy   Soft tissue mobilization Bilateral HS, Quads and ITB used IASTYM          Trigger Point Dry Needling - 09/16/14 1430    Consent Given? Yes   Education Handout Provided No  previously given bil   Muscles Treated Lower Body Quadriceps  bil rectus femoris, vastus lateralis and scar tissue   Quadriceps Response Twitch response elicited;Palpable increased muscle length              PT Education - 09/16/14 1508    Education provided Yes   Education Details Pt educated on 2 methods of quadriceps in standing and in prone for bil knees   Person(s) Educated Patient   Methods Explanation;Demonstration;Handout   Comprehension Verbalized understanding;Returned demonstration          PT Short Term Goals - 09/16/14 1509    PT SHORT TERM  GOAL #1   Title The patient will be instructed in basic stretching, strengthening and self care strategies to faciliate progress with rehab   Time 4   Period Weeks   Status On-going   PT SHORT TERM GOAL #2   Title The patient will report a 25% improvement in pain and function   Baseline Pt is 1/10 pain in knees   Time 4   Period Weeks   Status Achieved   PT SHORT TERM GOAL #3   Title The patient will be able to walk 6-7 minutes at work, shopping with min complaint of pain   Time 4   Period Weeks   Status On-going           PT Long Term Goals - 09/16/14 1510    PT LONG TERM GOAL #1   Title The patient will be independent in safe self progression of HEP and gym program as well as self care techniques for independent management   Time 8   Period Weeks   Status On-going   PT LONG TERM GOAL #2   Title The patient will report a 50% improvement in pain and function   Time 8   Period Weeks   Status On-going   PT LONG TERM GOAL #3   Title The patient will be able to walk 10 min with min complaint of pain   Time 8   Period Weeks   Status On-going   PT LONG TERM GOAL #4   Title The patient will have LEFS score of at least 50/80 indicating improved function with less pain   Baseline LEFS taken on 2nd visit 30/80   Time 8   Period Weeks   Status On-going               Plan - 09/16/14 1511    Clinical Impression Statement Pt enters clinic with 1/10 pain and much improved function, able to stand for 20 min rather than 3 min from evaluation.  Pt had dramatic improvement with one dry needling session with Ruben Im. LEFS 30/80 today . Goal 50/80  Pt with improvement in AROM  Continue toward progress wiht goals   Pt will benefit from skilled therapeutic intervention in order to improve on the following deficits Pain;Difficulty walking;Increased muscle spasms;Decreased strength;Decreased mobility   Rehab Potential Good   PT Frequency 2x / week   PT Duration 8 weeks  PT  Treatment/Interventions ADLs/Self Care Home Management;Cryotherapy;Electrical Stimulation;Moist Heat;Therapeutic activities;Therapeutic exercise;Patient/family education;Dry needling;Manual techniques;Scar mobilization   PT Next Visit Plan assess response to dry needling and continue toward goals   PT Home Exercise Plan quad stretch in prone and standing        Problem List Patient Active Problem List   Diagnosis Date Noted  . Arthritis of knee 06/25/2013    Voncille Lo, PT 09/16/2014 6:08 PM Phone: [phone] Fax: [facility] [place], [place], [address] Phone: [phone]   Fax:  [phone]

## 2014-09-25 ENCOUNTER — Ambulatory Visit: Payer: 59 | Admitting: Physical Therapy

## 2014-09-25 DIAGNOSIS — R262 Difficulty in walking, not elsewhere classified: Secondary | ICD-10-CM

## 2014-09-25 DIAGNOSIS — M25562 Pain in left knee: Principal | ICD-10-CM

## 2014-09-25 DIAGNOSIS — M6281 Muscle weakness (generalized): Secondary | ICD-10-CM

## 2014-09-25 DIAGNOSIS — M25561 Pain in right knee: Secondary | ICD-10-CM | POA: Diagnosis not present

## 2014-09-26 NOTE — Therapy (Signed)
[place], [place], [address] Phone: [phone]   Fax:  [phone]  Physical Therapy Treatment  Patient Details  Name: Karla Bishop MRN: [id number] Date of Birth: 1955/12/12 Referring Provider:  Florina Ou, MD  Encounter Date: 09/25/2014      PT End of Session - 09/26/14 0637    Visit Number 3   Number of Visits 16   Date for PT Re-Evaluation 10/30/14   Authorization Type UHC   PT Start Time 1500   PT Stop Time 1553   PT Time Calculation (min) 53 min   Activity Tolerance Patient tolerated treatment well      Past Medical History  Diagnosis Date  . Sleep apnea     no cpap  . Cancer     renal cell  . Arthritis   . PONV (postoperative nausea and vomiting)     last couple of surgeries, she has been spared  . Chronic kidney disease     left kidney removed for renal cell carcimona  . Fibromyalgia     Past Surgical History  Procedure Laterality Date  . Breast surgery Bilateral     brest reduction  . Abdominal hysterectomy    . Knee arthroscopic Bilateral   . Kidney removed Left   . Removal ovaries Bilateral     at 2 diffierent times due to cysts  . Total knee arthroplasty Right 05/22/2012    Procedure: TOTAL KNEE ARTHROPLASTY- right;  Surgeon: Meredith Pel, MD;  Location: [facility];  Service: Orthopedics;  Laterality: Right;  Right Total Knee Arthroplasty   . Total knee arthroplasty Left 06/25/2013    dr dean  . Total knee arthroplasty Left 06/25/2013    Procedure: TOTAL KNEE ARTHROPLASTY;  Surgeon: Meredith Pel, MD;  Location: [facility];  Service: Orthopedics;  Laterality: Left;    There were no vitals filed for this visit.  Visit Diagnosis:  Bilateral knee pain  Muscle weakness of lower extremity  Difficulty in walking      Subjective Assessment - 09/25/14 1503    Subjective No changes.  Feels stiffer/more sore on the medial thighs.  Sore after needling but not overly so.   Reports tissue is sensitive with the instrument assisted soft tissue work.    Currently in Pain? Yes   Pain Score 3   with walking   Pain Orientation Right;Left   Pain Onset More than a month ago   Pain Frequency Intermittent                         [facility] Adult PT Treatment/Exercise - 09/26/14 0001    Knee/Hip Exercises: Stretches   Hip Flexor Stretch Right;Left;3 reps;20 seconds   Other Knee/Hip Stretches Review of ex previously given   Moist Heat Therapy   Number Minutes Moist Heat 15 Minutes   Moist Heat Location Knee  B   Manual Therapy   Manual Therapy Soft tissue mobilization;Myofascial release   Soft tissue mobilization Bilateral Quads   Myofascial Release Graston technique G2,G3,G4 to B VMO,rectus femoris, VL, peripatellar region          Trigger Point Dry Needling - 09/26/14 0636    Consent Given? Yes   Muscles Treated Lower Body Quadriceps  multiple needles B marinating technique   Quadriceps Response Twitch response elicited;Palpable increased muscle length      Performed bilaterally          PT Short Term  Goals - 09/26/14 0644    PT SHORT TERM GOAL #1   Title The patient will be instructed in basic stretching, strengthening and self care strategies to faciliate progress with rehab   Status Achieved   PT SHORT TERM GOAL #2   Title The patient will report a 25% improvement in pain and function   Status Achieved   PT SHORT TERM GOAL #3   Title The patient will be able to walk 6-7 minutes at work, shopping with min complaint of pain   Time 4   Period Weeks   Status On-going           PT Long Term Goals - 09/26/14 0645    PT LONG TERM GOAL #1   Title The patient will be independent in safe self progression of HEP and gym program as well as self care techniques for independent management   Time 8   Period Weeks   Status On-going   PT LONG TERM GOAL #2   Title The patient will report a 50% improvement in pain and function    Time 8   Period Weeks   Status On-going   PT LONG TERM GOAL #3   Title The patient will be able to walk 10 min with min complaint of pain   Time 8   Period Weeks   Status On-going   PT LONG TERM GOAL #4   Title The patient will have LEFS score of at least 50/80 indicating improved function with less pain   Time 8   Period Weeks   Status On-going               Plan - 09/26/14 3009    Clinical Impression Statement The patient reports she is better than when she first started.  States she was sore after last visit from instrument assisted soft tissue work more than dry needling.  States she had some difficulty with prone quad stretch, modified to supine with LE over the edge of the bed.  The patient has improved muscle length following all interventions.  Therapist closely monitoring response throughout treatement session.     PT Next Visit Plan assess response to marinating technique with dry needling and Graston myofascial;  contract-relax hip flexors and add joint mobs for flexion        Problem List Patient Active Problem List   Diagnosis Date Noted  . Arthritis of knee 06/25/2013    Alvera Singh 09/26/2014, 6:46 AM  [facility] [address] [place], [place], [address] Phone: [phone]   Fax:  [phone]   Ruben Im, PT 09/26/2014 6:46 AM Phone: [phone] Fax: [phone]

## 2014-10-02 ENCOUNTER — Ambulatory Visit: Payer: 59 | Admitting: Physical Therapy

## 2014-10-02 DIAGNOSIS — M25562 Pain in left knee: Principal | ICD-10-CM

## 2014-10-02 DIAGNOSIS — M25561 Pain in right knee: Secondary | ICD-10-CM

## 2014-10-02 DIAGNOSIS — M6281 Muscle weakness (generalized): Secondary | ICD-10-CM

## 2014-10-02 DIAGNOSIS — R262 Difficulty in walking, not elsewhere classified: Secondary | ICD-10-CM

## 2014-10-02 NOTE — Therapy (Signed)
[place], [place], [address] Phone: [phone]   Fax:  [phone]  Physical Therapy Treatment  Patient Details  Name: Karla Bishop MRN: [id number] Date of Birth: Oct 26, 1955 Referring Provider:  Florina Ou, MD  Encounter Date: 10/02/2014      PT End of Session - 10/02/14 1618    Visit Number 4   Number of Visits 16   Date for PT Re-Evaluation 10/30/14   Authorization Type U[employer]   PT Start Time 0350   PT Stop Time 1605   PT Time Calculation (min) 71 min   Activity Tolerance Patient tolerated treatment well      Past Medical History  Diagnosis Date  . Sleep apnea     no cpap  . Cancer     renal cell  . Arthritis   . PONV (postoperative nausea and vomiting)     last couple of surgeries, she has been spared  . Chronic kidney disease     left kidney removed for renal cell carcimona  . Fibromyalgia     Past Surgical History  Procedure Laterality Date  . Breast surgery Bilateral     brest reduction  . Abdominal hysterectomy    . Knee arthroscopic Bilateral   . Kidney removed Left   . Removal ovaries Bilateral     at 2 diffierent times due to cysts  . Total knee arthroplasty Right 05/22/2012    Procedure: TOTAL KNEE ARTHROPLASTY- right;  Surgeon: Meredith Pel, MD;  Location: [facility];  Service: Orthopedics;  Laterality: Right;  Right Total Knee Arthroplasty   . Total knee arthroplasty Left 06/25/2013    dr dean  . Total knee arthroplasty Left 06/25/2013    Procedure: TOTAL KNEE ARTHROPLASTY;  Surgeon: Meredith Pel, MD;  Location: [facility];  Service: Orthopedics;  Laterality: Left;    There were no vitals filed for this visit.  Visit Diagnosis:  Bilateral knee pain  Difficulty in walking  Muscle weakness of lower extremity      Subjective Assessment - 10/02/14 1458    Subjective I'll still feel stiffness medial thighs but it feels better.  Did fine with the instrument assisted  .  some bruising.  I'm walking better.  I can keep up with them better walking with some one.     Currently in Pain? No/denies   Pain Orientation Right;Left   Pain Type Chronic pain            [facility] PT Assessment - 10/02/14 0001    AROM   Right Knee Flexion 130   Left Knee Flexion 127                     [facility] Adult PT Treatment/Exercise - 10/02/14 0001    Knee/Hip Exercises: Stretches   Other Knee/Hip Stretches supine "butterfly stretch" 3x 30 sec hold B   Other Knee/Hip Stretches standing side lunge for medial thigh stretch B 30 sec holds   Moist Heat Therapy   Number Minutes Moist Heat 15 Minutes   Moist Heat Location Knee   Manual Therapy   Soft tissue mobilization Bilateral quads (VMO)   Myofascial Release Graston technique instrument assisted myofascial G2,3,4 VMO, rectus, VL and peripatellar          Trigger Point Dry Needling - 10/02/14 1616    Consent Given? Yes   Muscles Treated Lower Body Quadriceps  particularly VMO   Quadriceps Response Twitch response elicited;Palpable increased muscle  length     Bilaterally.         PT Education - 10/02/14 1618    Education provided Yes   Education Details inner thigh stretches   Person(s) Educated Patient   Methods Explanation;Demonstration   Comprehension Verbalized understanding;Returned demonstration          PT Short Term Goals - 10/02/14 1732    PT SHORT TERM GOAL #1   Title The patient will be instructed in basic stretching, strengthening and self care strategies to faciliate progress with rehab   Status Achieved   PT SHORT TERM GOAL #2   Title The patient will report a 25% improvement in pain and function   Status Achieved   PT SHORT TERM GOAL #3   Title The patient will be able to walk 6-7 minutes at work, shopping with min complaint of pain   Status Achieved           PT Long Term Goals - 10/02/14 1732    PT LONG TERM GOAL #1   Title The patient will be independent in safe  self progression of HEP and gym program as well as self care techniques for independent management   Time 8   Period Weeks   Status On-going   PT LONG TERM GOAL #2   Title The patient will report a 50% improvement in pain and function   Time 8   Period Weeks   Status On-going   PT LONG TERM GOAL #3   Title The patient will be able to walk 10 min with min complaint of pain   Time 8   Period Weeks   Status On-going   PT LONG TERM GOAL #4   Title The patient will have LEFS score of at least 50/80 indicating improved function with less pain   Time 8   Period Weeks   Status On-going               Plan - 10/02/14 1730    Clinical Impression Statement The patient reports improved walking distance and speed.  Still some tightness but overall states she is feeling better.  Her syptoms are more medial thigh vs. lateral.  Good response to dry needling and Graston technique instrument assisted myofascial.  Encouraged home stretching and strengthening as instructed for best results long term.   Continue 2-4 more visits as needed.     PT Next Visit Plan assess response to marinating technique with dry needling and Graston myofascial;  contract-relax hip flexors and add joint mobs for flexion        Problem List Patient Active Problem List   Diagnosis Date Noted  . Arthritis of knee 06/25/2013    Alvera Singh 10/02/2014, 5:34 PM  [facility] [address] [place], [place], [address] Phone: [phone]   Fax:  [phone]  Ruben Im, PT 10/02/2014 5:35 PM Phone: [phone] Fax: [phone]

## 2014-10-02 NOTE — Patient Instructions (Signed)
Supine butterfly stretch 3x 20-30 sec both sides;  Standing side lunge for inner thigh stretch 3x 20-30 sec both sides  Daily

## 2014-10-09 ENCOUNTER — Ambulatory Visit: Payer: 59 | Attending: Orthopedic Surgery | Admitting: Physical Therapy

## 2014-10-09 DIAGNOSIS — M25561 Pain in right knee: Secondary | ICD-10-CM | POA: Diagnosis present

## 2014-10-09 DIAGNOSIS — R262 Difficulty in walking, not elsewhere classified: Secondary | ICD-10-CM | POA: Insufficient documentation

## 2014-10-09 DIAGNOSIS — M25562 Pain in left knee: Secondary | ICD-10-CM | POA: Diagnosis present

## 2014-10-09 DIAGNOSIS — M6281 Muscle weakness (generalized): Secondary | ICD-10-CM

## 2014-10-09 NOTE — Therapy (Signed)
[place], [place], [address] Phone: [phone]   Fax:  [phone]  Physical Therapy Treatment  Patient Details  Name: Karla Bishop MRN: [id number] Date of Birth: [birth year] Referring Provider:  Florina Ou, MD  Encounter Date: 10/09/2014      PT End of Session - 10/09/14 1606    Visit Number 5   Number of Visits 16   Date for PT Re-Evaluation 10/30/14   Authorization Type UHC   PT Start Time 1500   PT Stop Time 1605   PT Time Calculation (min) 65 min   Activity Tolerance Patient tolerated treatment well      Past Medical History  Diagnosis Date  . Sleep apnea     no cpap  . Cancer     renal cell  . Arthritis   . PONV (postoperative nausea and vomiting)     last couple of surgeries, she has been spared  . Chronic kidney disease     left kidney removed for renal cell carcimona  . Fibromyalgia     Past Surgical History  Procedure Laterality Date  . Breast surgery Bilateral     brest reduction  . Abdominal hysterectomy    . Knee arthroscopic Bilateral   . Kidney removed Left   . Removal ovaries Bilateral     at 2 diffierent times due to cysts  . Total knee arthroplasty Right 05/22/2012    Procedure: TOTAL KNEE ARTHROPLASTY- right;  Surgeon: Meredith Pel, MD;  Location: [facility];  Service: Orthopedics;  Laterality: Right;  Right Total Knee Arthroplasty   . Total knee arthroplasty Left 06/25/2013    dr dean  . Total knee arthroplasty Left 06/25/2013    Procedure: TOTAL KNEE ARTHROPLASTY;  Surgeon: Meredith Pel, MD;  Location: [facility];  Service: Orthopedics;  Laterality: Left;    There were no vitals filed for this visit.  Visit Diagnosis:  Bilateral knee pain  Difficulty in walking  Muscle weakness of lower extremity      Subjective Assessment - 10/09/14 1501    Subjective continues to have B medial thigh pain which has improved slightly from last time.  No lateral thigh  pain.  I"m always tender medial calves.  Doing the Elliptical on Monday and Tuesday with less stiffness and pain getting off.  Same with bike.  Also do floor exercises.   Currently in Pain? No/denies   Pain Score 0-No pain   Pain Orientation Right;Left   Pain Type Chronic pain   Pain Onset More than a month ago   Pain Frequency Intermittent   Aggravating Factors  standing, walking   Pain Relieving Factors sitting                         [facility] Adult PT Treatment/Exercise - 10/09/14 1554    Knee/Hip Exercises: Stretches   Hip Flexor Stretch Right;Left;1 rep;60 seconds   Other Knee/Hip Stretches see HEP   Moist Heat Therapy   Number Minutes Moist Heat 10 Minutes   Moist Heat Location Knee   Manual Therapy   Soft tissue mobilization Bilateral quads and medial gastroc   Myofascial Release Graston instument assisted myofascial quads, peripatellar and medial gastroc          Trigger Point Dry Needling - 10/09/14 1606    Consent Given? Yes   Muscles Treated Lower Body Quadriceps   Quadriceps Response Twitch response elicited;Palpable increased muscle length  Performed bilaterally        PT Education - 10/09/14 1606    Education provided Yes   Education Details medial thigh stretches for work and in the pool   Person(s) Educated Patient   Methods Explanation;Handout   Comprehension Verbalized understanding          PT Short Term Goals - 10/09/14 1609    PT SHORT TERM GOAL #1   Title The patient will be instructed in basic stretching, strengthening and self care strategies to faciliate progress with rehab   Status Achieved   PT [place] #2   Title The patient will report a 25% improvement in pain and function   Status Achieved   PT SHORT TERM GOAL #3   Title The patient will be able to walk 6-7 minutes at work, shopping with min complaint of pain   Status Achieved           PT Long Term Goals - 10/09/14 1609    PT LONG TERM GOAL #1    Title The patient will be independent in safe self progression of HEP and gym program as well as self care techniques for independent management   Time 8   Period Weeks   Status On-going   PT LONG TERM GOAL #2   Title The patient will report a 50% improvement in pain and function   Time 8   Period Weeks   Status On-going   PT LONG TERM GOAL #3   Title The patient will be able to walk 10 min with min complaint of pain   Time 8   Period Weeks   Status On-going   PT LONG TERM GOAL #4   Title The patient will have LEFS score of at least 50/80 indicating improved function with less pain   Time 8   Period Weeks   Status On-going               Plan - 10/09/14 1607    Clinical Impression Statement Minimal change since last visit but overall with greater ease after rising and getting off gym equipment.  Medial thigh and medial lower leg tender points decreased in size and number.  Improved muscle length following interventions.  Possible discharge next visit if majority of goals met.     PT Next Visit Plan assess response to marinating technique with dry needling and Graston myofascial;  review progress toward goals, LEFS;  possible discharge next visit        Problem List Patient Active Problem List   Diagnosis Date Noted  . Arthritis of knee 06/25/2013    Alvera Singh 10/09/2014, 4:10 PM  [facility] [address] [place], [place], [address] Phone: [phone]   Fax:  [phone]    Ruben Im, PT 10/09/2014 4:10 PM Phone: [phone] Fax: [phone]

## 2014-10-14 ENCOUNTER — Ambulatory Visit: Payer: 59 | Admitting: Physical Therapy

## 2014-10-30 ENCOUNTER — Ambulatory Visit: Payer: 59 | Admitting: Physical Therapy

## 2014-10-30 DIAGNOSIS — M25562 Pain in left knee: Principal | ICD-10-CM

## 2014-10-30 DIAGNOSIS — R262 Difficulty in walking, not elsewhere classified: Secondary | ICD-10-CM

## 2014-10-30 DIAGNOSIS — M25561 Pain in right knee: Secondary | ICD-10-CM

## 2014-10-30 DIAGNOSIS — M6281 Muscle weakness (generalized): Secondary | ICD-10-CM
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
## 2014-11-20 ENCOUNTER — Encounter: Payer: 59 | Admitting: Physical Therapy

## 2014-12-02 ENCOUNTER — Encounter: Payer: 59 | Admitting: Physical Therapy

## 2015-12-04 ENCOUNTER — Encounter: Payer: Self-pay | Admitting: Internal Medicine

## 2016-01-29 IMAGING — CR DG CHEST 2V
2 series · 2 of 2 positions shown · non-contrast
Comparison: 05/14/2012.

CLINICAL DATA: Preop for left knee replacement

EXAM:
CHEST  2 VIEW

[w chest pa]
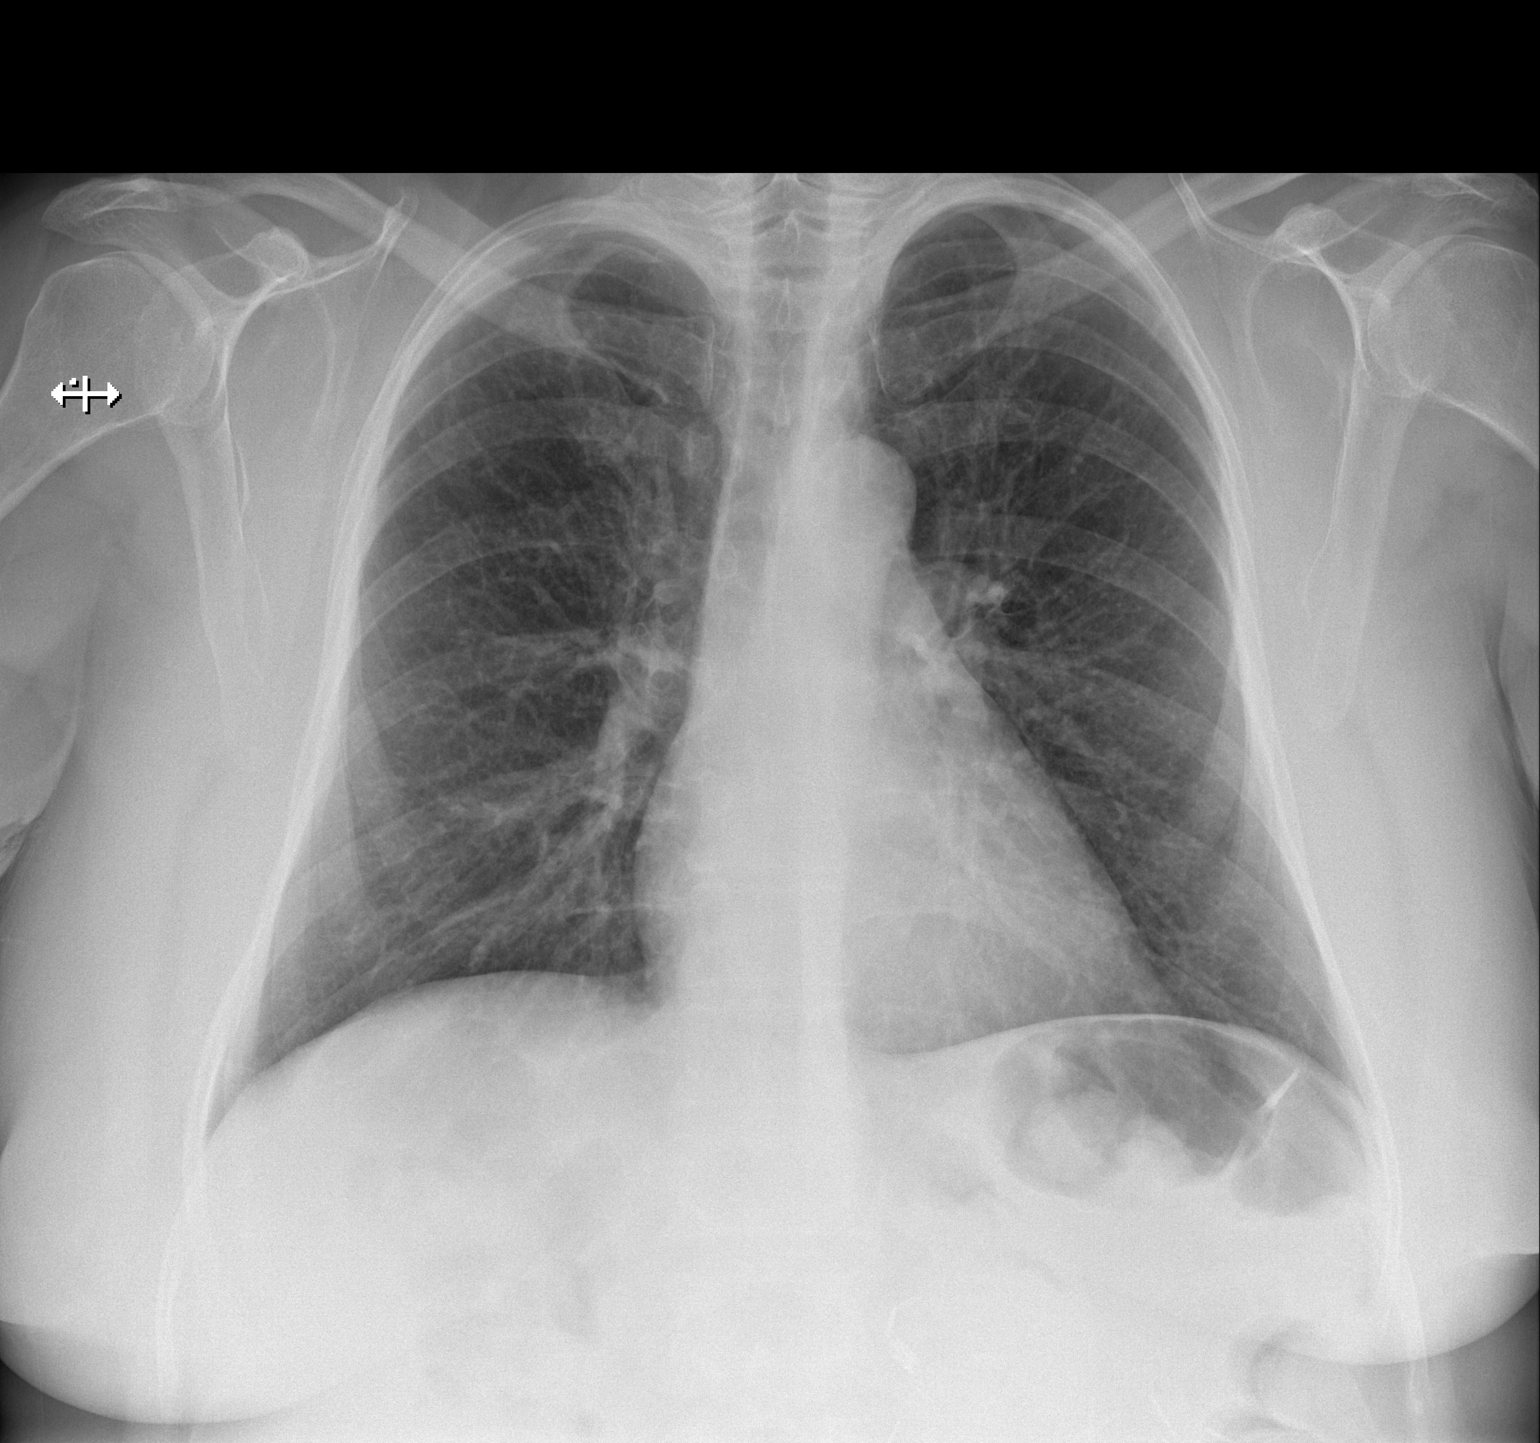

[w chest lat]
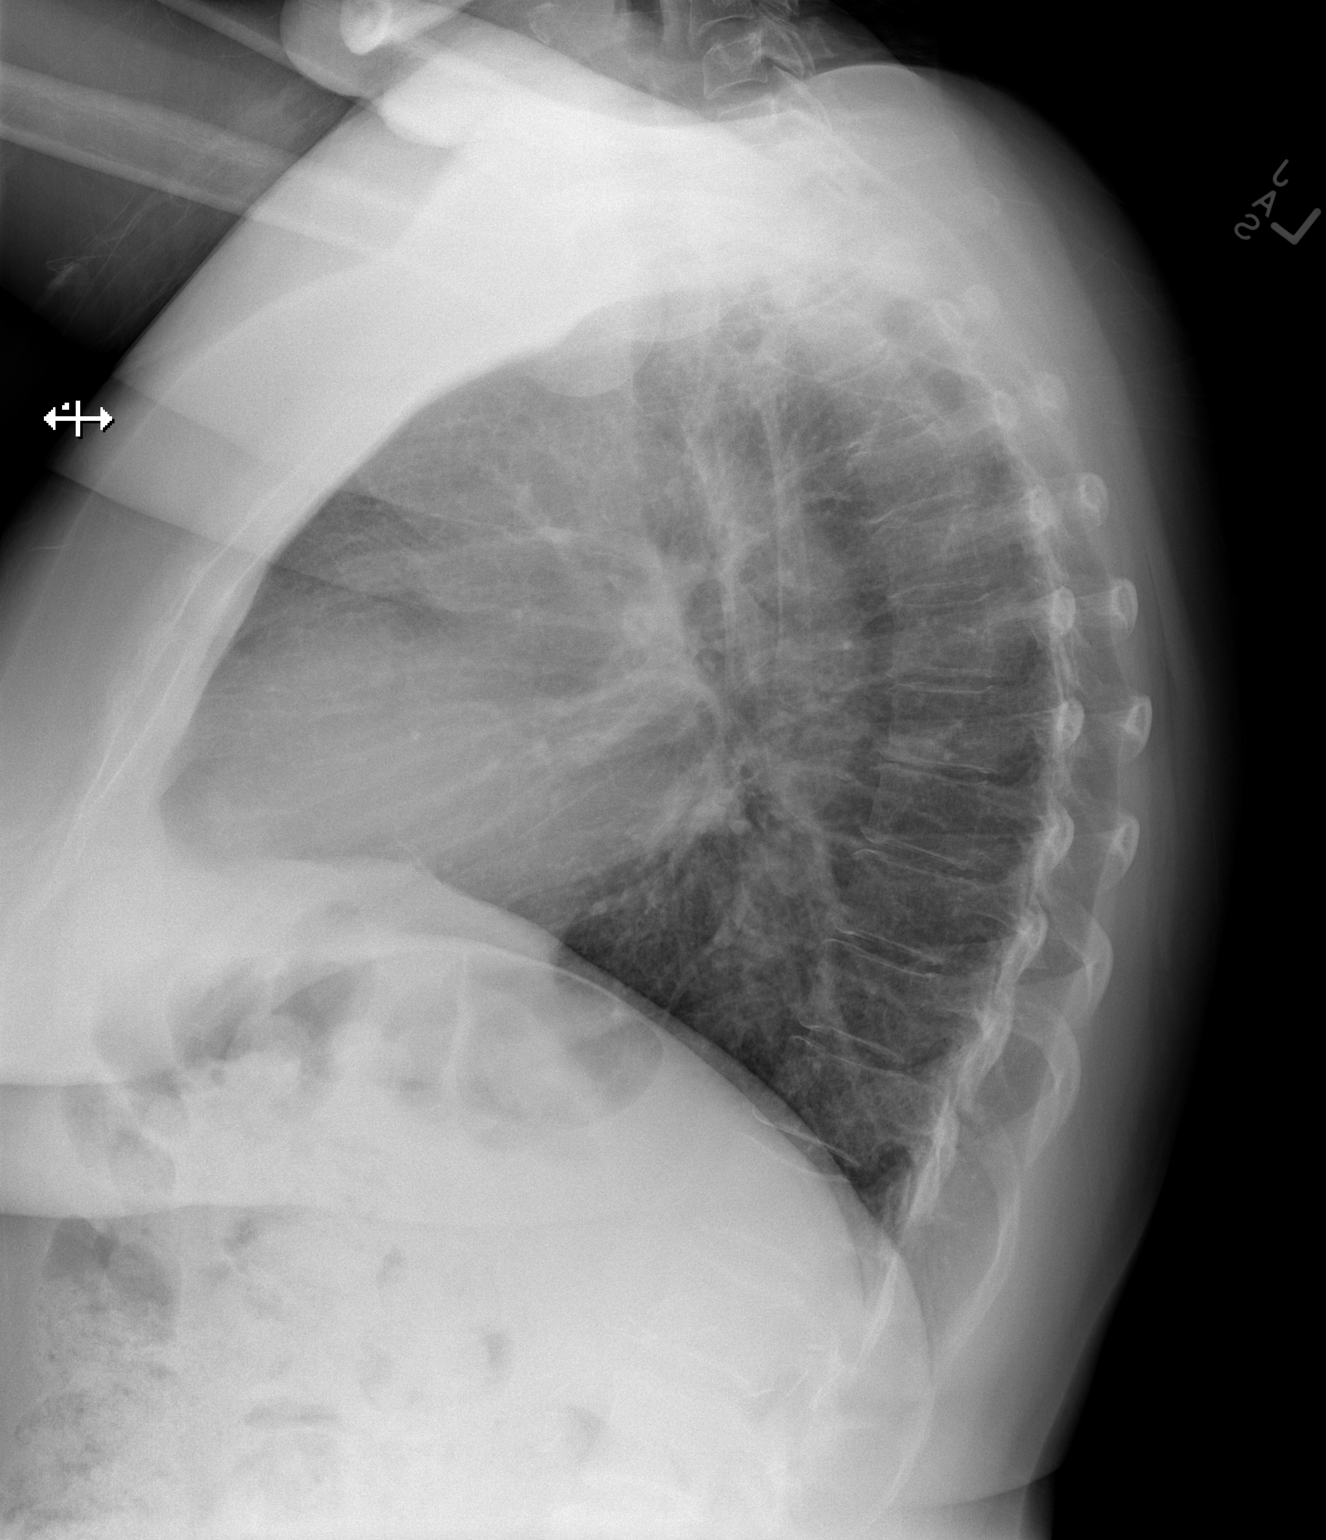

[2 of 2 positions shown; findings below may reference images not displayed]

FINDINGS: Cardiomediastinal silhouette is unremarkable. No acute infiltrate or
pleural effusion. No pulmonary edema. Mild degenerative changes mid
thoracic spine.
IMPRESSION: No active cardiopulmonary disease.

## 2018-07-23 ENCOUNTER — Telehealth: Payer: Self-pay | Admitting: Orthopedic Surgery

## 2018-07-23 NOTE — Telephone Encounter (Signed)
Patient called needing records sent to a new doctor where she lives in [place]. I advised we need release form signed. I emailed release form to her per her request to [email or web address]

## 2018-09-06 ENCOUNTER — Telehealth: Payer: Self-pay | Admitting: Orthopedic Surgery

## 2018-09-06 NOTE — Telephone Encounter (Signed)
Received Vm from Rhae Lerner w/ [employer]. Requesting [facility] OP notes from 2014. I faxed [phone], [facility] [phone]
